# Patient Record
Sex: Female | Born: 1950 | Race: White | Hispanic: No | Marital: Married | Smoking: Never smoker
Health system: Southern US, Community
[De-identification: ages and names within clinical notes are randomized; demographics above are authoritative.]

## PROBLEM LIST (undated history)

## (undated) DIAGNOSIS — E079 Disorder of thyroid, unspecified: Secondary | ICD-10-CM

## (undated) DIAGNOSIS — H269 Unspecified cataract: Secondary | ICD-10-CM

## (undated) HISTORY — DX: Disorder of thyroid, unspecified: E07.9

## (undated) HISTORY — DX: Unspecified cataract: H26.9

---
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
## 1971-02-15 HISTORY — PX: WISDOM TOOTH EXTRACTION: SHX21

## 1972-02-15 HISTORY — PX: EYE SURGERY: SHX253

## 1995-02-15 HISTORY — PX: BREAST BIOPSY: SHX20

## 1998-04-20 ENCOUNTER — Other Ambulatory Visit: Admission: RE | Admit: 1998-04-20 | Discharge: 1998-04-20 | Payer: Self-pay | Admitting: Obstetrics & Gynecology

## 1999-04-02 ENCOUNTER — Other Ambulatory Visit: Admission: RE | Admit: 1999-04-02 | Discharge: 1999-04-02 | Payer: Self-pay | Admitting: Obstetrics and Gynecology

## 1999-11-29 ENCOUNTER — Encounter: Admission: RE | Admit: 1999-11-29 | Discharge: 1999-11-29 | Payer: Self-pay | Admitting: Obstetrics and Gynecology

## 1999-11-29 ENCOUNTER — Encounter: Payer: Self-pay | Admitting: Obstetrics and Gynecology

## 2000-08-01 ENCOUNTER — Other Ambulatory Visit: Admission: RE | Admit: 2000-08-01 | Discharge: 2000-08-01 | Payer: Self-pay | Admitting: Obstetrics and Gynecology

## 2000-12-20 ENCOUNTER — Encounter: Payer: Self-pay | Admitting: Obstetrics and Gynecology

## 2000-12-20 ENCOUNTER — Encounter: Admission: RE | Admit: 2000-12-20 | Discharge: 2000-12-20 | Payer: Self-pay | Admitting: Obstetrics and Gynecology

## 2000-12-22 ENCOUNTER — Encounter: Admission: RE | Admit: 2000-12-22 | Discharge: 2000-12-22 | Payer: Self-pay | Admitting: Obstetrics and Gynecology

## 2000-12-22 ENCOUNTER — Encounter: Payer: Self-pay | Admitting: Obstetrics and Gynecology

## 2001-08-02 ENCOUNTER — Other Ambulatory Visit: Admission: RE | Admit: 2001-08-02 | Discharge: 2001-08-02 | Payer: Self-pay | Admitting: Obstetrics and Gynecology

## 2002-01-07 ENCOUNTER — Encounter: Admission: RE | Admit: 2002-01-07 | Discharge: 2002-01-07 | Payer: Self-pay | Admitting: Obstetrics and Gynecology

## 2002-01-07 ENCOUNTER — Encounter: Payer: Self-pay | Admitting: Obstetrics and Gynecology

## 2002-09-23 ENCOUNTER — Other Ambulatory Visit: Admission: RE | Admit: 2002-09-23 | Discharge: 2002-09-23 | Payer: Self-pay | Admitting: Obstetrics and Gynecology

## 2003-07-31 ENCOUNTER — Encounter: Admission: RE | Admit: 2003-07-31 | Discharge: 2003-07-31 | Payer: Self-pay | Admitting: Obstetrics and Gynecology

## 2003-09-30 ENCOUNTER — Other Ambulatory Visit: Admission: RE | Admit: 2003-09-30 | Discharge: 2003-09-30 | Payer: Self-pay | Admitting: Obstetrics and Gynecology

## 2004-09-21 ENCOUNTER — Encounter: Admission: RE | Admit: 2004-09-21 | Discharge: 2004-09-21 | Payer: Self-pay | Admitting: Obstetrics and Gynecology

## 2004-09-21 ENCOUNTER — Other Ambulatory Visit: Admission: RE | Admit: 2004-09-21 | Discharge: 2004-09-21 | Payer: Self-pay | Admitting: Obstetrics and Gynecology

## 2004-10-22 ENCOUNTER — Encounter: Admission: RE | Admit: 2004-10-22 | Discharge: 2004-10-22 | Payer: Self-pay | Admitting: Gynecology

## 2005-02-14 HISTORY — PX: COLONOSCOPY: SHX174

## 2005-04-29 ENCOUNTER — Ambulatory Visit: Payer: Self-pay | Admitting: Sports Medicine

## 2005-05-06 ENCOUNTER — Ambulatory Visit: Payer: Self-pay | Admitting: Sports Medicine

## 2005-11-23 ENCOUNTER — Encounter: Admission: RE | Admit: 2005-11-23 | Discharge: 2005-11-23 | Payer: Self-pay | Admitting: Obstetrics and Gynecology

## 2005-12-08 ENCOUNTER — Ambulatory Visit: Payer: Self-pay | Admitting: Internal Medicine

## 2005-12-22 ENCOUNTER — Encounter (INDEPENDENT_AMBULATORY_CARE_PROVIDER_SITE_OTHER): Payer: Self-pay | Admitting: *Deleted

## 2005-12-22 ENCOUNTER — Ambulatory Visit: Payer: Self-pay | Admitting: Internal Medicine

## 2007-01-17 ENCOUNTER — Encounter: Admission: RE | Admit: 2007-01-17 | Discharge: 2007-01-17 | Payer: Self-pay | Admitting: Obstetrics and Gynecology

## 2007-01-31 ENCOUNTER — Encounter: Admission: RE | Admit: 2007-01-31 | Discharge: 2007-01-31 | Payer: Self-pay | Admitting: Obstetrics and Gynecology

## 2007-01-31 ENCOUNTER — Encounter (INDEPENDENT_AMBULATORY_CARE_PROVIDER_SITE_OTHER): Payer: Self-pay | Admitting: Diagnostic Radiology

## 2007-08-29 ENCOUNTER — Encounter: Admission: RE | Admit: 2007-08-29 | Discharge: 2007-08-29 | Payer: Self-pay | Admitting: Obstetrics and Gynecology

## 2008-03-03 ENCOUNTER — Encounter: Admission: RE | Admit: 2008-03-03 | Discharge: 2008-03-03 | Payer: Self-pay | Admitting: Obstetrics and Gynecology

## 2008-09-24 ENCOUNTER — Encounter: Admission: RE | Admit: 2008-09-24 | Discharge: 2008-09-24 | Payer: Self-pay | Admitting: Obstetrics and Gynecology

## 2009-03-18 ENCOUNTER — Encounter: Admission: RE | Admit: 2009-03-18 | Discharge: 2009-03-18 | Payer: Self-pay | Admitting: Obstetrics and Gynecology

## 2010-03-07 ENCOUNTER — Encounter: Payer: Self-pay | Admitting: Gynecology

## 2010-06-29 ENCOUNTER — Other Ambulatory Visit: Payer: Self-pay | Admitting: Obstetrics and Gynecology

## 2010-06-29 DIAGNOSIS — Z1231 Encounter for screening mammogram for malignant neoplasm of breast: Secondary | ICD-10-CM

## 2010-06-29 DIAGNOSIS — Z780 Asymptomatic menopausal state: Secondary | ICD-10-CM

## 2010-07-07 ENCOUNTER — Ambulatory Visit
Admission: RE | Admit: 2010-07-07 | Discharge: 2010-07-07 | Disposition: A | Discharge status: Home / Self Care | Payer: 59 | Source: Ambulatory Visit | Attending: Obstetrics and Gynecology | Admitting: Obstetrics and Gynecology

## 2010-07-07 DIAGNOSIS — Z780 Asymptomatic menopausal state: Secondary | ICD-10-CM

## 2010-07-07 DIAGNOSIS — Z1231 Encounter for screening mammogram for malignant neoplasm of breast: Secondary | ICD-10-CM

## 2010-12-23 ENCOUNTER — Encounter: Payer: Self-pay | Admitting: Internal Medicine

## 2011-02-15 HISTORY — PX: COLONOSCOPY: SHX174

## 2011-03-22 ENCOUNTER — Encounter: Payer: Self-pay | Admitting: Internal Medicine

## 2011-04-12 ENCOUNTER — Ambulatory Visit (AMBULATORY_SURGERY_CENTER): Payer: 59 | Admitting: *Deleted

## 2011-04-12 VITALS — Ht 66.0 in | Wt 130.4 lb

## 2011-04-12 DIAGNOSIS — Z1211 Encounter for screening for malignant neoplasm of colon: Secondary | ICD-10-CM

## 2011-04-12 MED ORDER — PEG-KCL-NACL-NASULF-NA ASC-C 100 G PO SOLR: ORAL | Status: DC

## 2011-04-26 ENCOUNTER — Encounter: Payer: Self-pay | Admitting: Internal Medicine

## 2011-04-26 ENCOUNTER — Ambulatory Visit (AMBULATORY_SURGERY_CENTER): Payer: 59 | Admitting: Internal Medicine

## 2011-04-26 VITALS — BP 110/70 | HR 62 | Temp 98.1°F | Resp 18 | Ht 66.0 in | Wt 130.0 lb

## 2011-04-26 DIAGNOSIS — Z1211 Encounter for screening for malignant neoplasm of colon: Secondary | ICD-10-CM

## 2011-04-26 DIAGNOSIS — Z86010 Personal history of colonic polyps: Secondary | ICD-10-CM

## 2011-04-26 DIAGNOSIS — Z800 Family history of malignant neoplasm of digestive organs: Secondary | ICD-10-CM

## 2011-04-26 MED ORDER — SODIUM CHLORIDE 0.9 % IV SOLN: 500.0000 mL | INTRAVENOUS | Status: DC

## 2011-04-26 NOTE — Patient Instructions (Signed)
YOU HAD AN ENDOSCOPIC PROCEDURE TODAY AT THE [facility]: Refer to the procedure report that was given to you for any specific questions about what was found during the examination.  If the procedure report does not answer your questions, please call your gastroenterologist to clarify.  If you requested that your care partner not be given the details of your procedure findings, then the procedure report has been included in a sealed envelope for you to review at your convenience later.  YOU SHOULD EXPECT: Some feelings of bloating in the abdomen. Passage of more gas than usual.  Walking can help get rid of the air that was put into your GI tract during the procedure and reduce the bloating. If you had a lower endoscopy (such as a colonoscopy or flexible sigmoidoscopy) you may notice spotting of blood in your stool or on the toilet paper. If you underwent a bowel prep for your procedure, then you may not have a normal bowel movement for a few days.  DIET: Your first meal following the procedure should be a light meal and then it is ok to progress to your normal diet.  A half-sandwich or bowl of soup is an example of a good first meal.  Heavy or fried foods are harder to digest and may make you feel nauseous or bloated.  Likewise meals heavy in dairy and vegetables can cause extra gas to form and this can also increase the bloating.  Drink plenty of fluids but you should avoid alcoholic beverages for 24 hours.  ACTIVITY: Your care partner should take you home directly after the procedure.  You should plan to take it easy, moving slowly for the rest of the day.  You can resume normal activity the day after the procedure however you should NOT DRIVE or use heavy machinery for 24 hours (because of the sedation medicines used during the test).    SYMPTOMS TO REPORT IMMEDIATELY: A gastroenterologist can be reached at any hour.  During normal business hours, 8:30 AM to 5:00 PM Monday through Friday,  call [phone].  After hours and on weekends, please call the GI answering service at [phone] who will take a message and have the physician on call contact you.   Following lower endoscopy (colonoscopy or flexible sigmoidoscopy):  Excessive amounts of blood in the stool  Significant tenderness or worsening of abdominal pains  Swelling of the abdomen that is new, acute  Fever of 100F or higher    FOLLOW UP: If any biopsies were taken you will be contacted by phone or by letter within the next 1-3 weeks.  Call your gastroenterologist if you have not heard about the biopsies in 3 weeks.  Our staff will call the home number listed on your records the next business day following your procedure to check on you and address any questions or concerns that you may have at that time regarding the information given to you following your procedure. This is a courtesy call and so if there is no answer at the home number and we have not heard from you through the emergency physician on call, we will assume that you have returned to your regular daily activities without incident.  SIGNATURES/CONFIDENTIALITY: You and/or your care partner have signed paperwork which will be entered into your electronic medical record.  These signatures attest to the fact that that the information above on your After Visit Summary has been reviewed and is understood.  Full responsibility of the confidentiality   of this discharge information lies with you and/or your care-partner.     

## 2011-04-26 NOTE — Progress Notes (Signed)
Patient did not experience any of the following events: a burn prior to discharge; a fall within the facility; wrong site/side/patient/procedure/implant event; or a hospital transfer or hospital admission upon discharge from the facility. (G8907) Patient did not have preoperative order for IV antibiotic SSI prophylaxis. (G8918)  

## 2011-04-26 NOTE — Op Note (Signed)
[facility] [address] [place], [place]  [address]  COLONOSCOPY PROCEDURE REPORT  PATIENT:  Kiara Logan, Kiara Logan  MR#:  [id number] BIRTHDATE:  1950/12/17, 60 yrs. old  GENDER:  female ENDOSCOPIST:  Wilhemina Bonito. Eda Keys, MD REF. BY:  Surveillance Program Recall, PROCEDURE DATE:  04/26/2011 PROCEDURE:  Surveillance Colonoscopy ASA CLASS:  Class II INDICATIONS:  history of polyps, surveillance and high-risk screening, family history of colon cancer ; index 12-2005 w/ nonadenomatous polyp MEDICATIONS:   MAC sedation, administered by CRNA, propofol (Diprivan) 200 mg IV  DESCRIPTION OF PROCEDURE:   After the risks benefits and alternatives of the procedure were thoroughly explained, informed consent was obtained.  Digital rectal exam was performed and revealed no abnormalities.   The LB 180AL [id number] endoscope was introduced through the anus and advanced to the cecum, which was identified by both the appendix and ileocecal valve, without limitations.  The quality of the prep was excellent, using MoviPrep.  The instrument was then slowly withdrawn as the colon was fully examined. <<PROCEDUREIMAGES>>  FINDINGS:  A normal appearing cecum, ileocecal valve, and appendiceal orifice were identified. The ascending, hepatic flexure, transverse, splenic flexure, descending, sigmoid colon, and rectum appeared unremarkable.  No polyps or cancers were seen. Retroflexed views in the rectum revealed no abnormalities.    The time to cecum =  4:25  minutes. The scope was then withdrawn in 7:53  minutes from the cecum and the procedure completed.  COMPLICATIONS:  None  ENDOSCOPIC IMPRESSION: 1) Normal colon 2) No polyps or cancers  RECOMMENDATIONS: 1) Continue current colorectal screening recommendations  with a repeat colonoscopy in 10 years.  ______________________________ Wilhemina Bonito. Eda Keys, MD  CC:  Juluis Rainier, MD;  The Patient  n. eSIGNED:   Wilhemina Bonito. Eda Keys at 04/26/2011  02:09 PM  Gershon Crane, [id number]

## 2011-04-27 ENCOUNTER — Telehealth: Payer: Self-pay | Admitting: *Deleted

## 2011-04-27 NOTE — Telephone Encounter (Signed)
  Follow up Call-  Call back number 04/26/2011  Post procedure Call Back phone  # [phone] cell  Permission to leave phone message Yes     Patient questions:  Do you have a fever, pain , or abdominal swelling? no Pain Score  0 *  Have you tolerated food without any problems? yes  Have you been able to return to your normal activities? yes  Do you have any questions about your discharge instructions: Diet   no Medications  no Follow up visit  no  Do you have questions or concerns about your Care? no  Actions: * If pain score is 4 or above: No action needed, pain <4.

## 2011-08-17 ENCOUNTER — Other Ambulatory Visit: Payer: Self-pay | Admitting: Obstetrics and Gynecology

## 2011-08-17 DIAGNOSIS — Z1231 Encounter for screening mammogram for malignant neoplasm of breast: Secondary | ICD-10-CM

## 2011-08-30 ENCOUNTER — Ambulatory Visit
Admission: RE | Admit: 2011-08-30 | Discharge: 2011-08-30 | Disposition: A | Discharge status: Home / Self Care | Payer: 59 | Source: Ambulatory Visit | Attending: Obstetrics and Gynecology | Admitting: Obstetrics and Gynecology

## 2011-08-30 DIAGNOSIS — Z1231 Encounter for screening mammogram for malignant neoplasm of breast: Secondary | ICD-10-CM

## 2012-11-07 ENCOUNTER — Other Ambulatory Visit: Payer: Self-pay

## 2012-11-07 DIAGNOSIS — Z1231 Encounter for screening mammogram for malignant neoplasm of breast: Secondary | ICD-10-CM

## 2012-11-27 ENCOUNTER — Ambulatory Visit: Payer: 59

## 2012-12-11 ENCOUNTER — Ambulatory Visit
Admission: RE | Admit: 2012-12-11 | Discharge: 2012-12-11 | Disposition: A | Discharge status: Home / Self Care | Payer: 59 | Source: Ambulatory Visit

## 2012-12-11 DIAGNOSIS — Z1231 Encounter for screening mammogram for malignant neoplasm of breast: Secondary | ICD-10-CM

## 2013-02-14 HISTORY — PX: BUNIONECTOMY: SHX129

## 2014-02-14 HISTORY — PX: BUNIONECTOMY: SHX129

## 2014-06-17 DIAGNOSIS — N952 Postmenopausal atrophic vaginitis: Secondary | ICD-10-CM | POA: Diagnosis not present

## 2014-06-17 DIAGNOSIS — Z6821 Body mass index (BMI) 21.0-21.9, adult: Secondary | ICD-10-CM | POA: Diagnosis not present

## 2014-06-17 DIAGNOSIS — Z1231 Encounter for screening mammogram for malignant neoplasm of breast: Secondary | ICD-10-CM | POA: Diagnosis not present

## 2014-06-17 DIAGNOSIS — Z01419 Encounter for gynecological examination (general) (routine) without abnormal findings: Secondary | ICD-10-CM | POA: Diagnosis not present

## 2014-06-17 DIAGNOSIS — Z124 Encounter for screening for malignant neoplasm of cervix: Secondary | ICD-10-CM | POA: Diagnosis not present

## 2014-06-17 DIAGNOSIS — Z1389 Encounter for screening for other disorder: Secondary | ICD-10-CM | POA: Diagnosis not present

## 2014-06-17 DIAGNOSIS — Z1151 Encounter for screening for human papillomavirus (HPV): Secondary | ICD-10-CM | POA: Diagnosis not present

## 2014-06-18 DIAGNOSIS — L814 Other melanin hyperpigmentation: Secondary | ICD-10-CM | POA: Diagnosis not present

## 2014-06-18 DIAGNOSIS — L821 Other seborrheic keratosis: Secondary | ICD-10-CM | POA: Diagnosis not present

## 2014-06-18 DIAGNOSIS — D2371 Other benign neoplasm of skin of right lower limb, including hip: Secondary | ICD-10-CM | POA: Diagnosis not present

## 2014-06-18 DIAGNOSIS — D1801 Hemangioma of skin and subcutaneous tissue: Secondary | ICD-10-CM | POA: Diagnosis not present

## 2014-06-18 DIAGNOSIS — L919 Hypertrophic disorder of the skin, unspecified: Secondary | ICD-10-CM | POA: Diagnosis not present

## 2014-06-18 DIAGNOSIS — D225 Melanocytic nevi of trunk: Secondary | ICD-10-CM | POA: Diagnosis not present

## 2014-08-11 DIAGNOSIS — J3489 Other specified disorders of nose and nasal sinuses: Secondary | ICD-10-CM | POA: Diagnosis not present

## 2014-09-19 DIAGNOSIS — N390 Urinary tract infection, site not specified: Secondary | ICD-10-CM | POA: Diagnosis not present

## 2014-09-24 DIAGNOSIS — M2041 Other hammer toe(s) (acquired), right foot: Secondary | ICD-10-CM | POA: Diagnosis not present

## 2014-09-24 DIAGNOSIS — M2011 Hallux valgus (acquired), right foot: Secondary | ICD-10-CM | POA: Diagnosis not present

## 2014-10-06 DIAGNOSIS — M8588 Other specified disorders of bone density and structure, other site: Secondary | ICD-10-CM | POA: Diagnosis not present

## 2014-10-06 DIAGNOSIS — Z79899 Other long term (current) drug therapy: Secondary | ICD-10-CM | POA: Diagnosis not present

## 2014-10-06 DIAGNOSIS — Z0000 Encounter for general adult medical examination without abnormal findings: Secondary | ICD-10-CM | POA: Diagnosis not present

## 2014-10-06 DIAGNOSIS — E780 Pure hypercholesterolemia: Secondary | ICD-10-CM | POA: Diagnosis not present

## 2014-10-06 DIAGNOSIS — Z1389 Encounter for screening for other disorder: Secondary | ICD-10-CM | POA: Diagnosis not present

## 2014-10-06 DIAGNOSIS — E039 Hypothyroidism, unspecified: Secondary | ICD-10-CM | POA: Diagnosis not present

## 2014-11-19 DIAGNOSIS — D485 Neoplasm of uncertain behavior of skin: Secondary | ICD-10-CM | POA: Diagnosis not present

## 2014-11-19 DIAGNOSIS — L820 Inflamed seborrheic keratosis: Secondary | ICD-10-CM | POA: Diagnosis not present

## 2014-12-16 DIAGNOSIS — Q6621 Congenital metatarsus primus varus: Secondary | ICD-10-CM | POA: Diagnosis not present

## 2014-12-16 DIAGNOSIS — M21611 Bunion of right foot: Secondary | ICD-10-CM | POA: Diagnosis not present

## 2014-12-16 DIAGNOSIS — G8918 Other acute postprocedural pain: Secondary | ICD-10-CM | POA: Diagnosis not present

## 2014-12-16 DIAGNOSIS — M7741 Metatarsalgia, right foot: Secondary | ICD-10-CM | POA: Diagnosis not present

## 2014-12-16 DIAGNOSIS — M2010 Hallux valgus (acquired), unspecified foot: Secondary | ICD-10-CM | POA: Diagnosis not present

## 2014-12-16 DIAGNOSIS — M2011 Hallux valgus (acquired), right foot: Secondary | ICD-10-CM | POA: Diagnosis not present

## 2014-12-16 DIAGNOSIS — M216X1 Other acquired deformities of right foot: Secondary | ICD-10-CM | POA: Diagnosis not present

## 2014-12-16 DIAGNOSIS — M2041 Other hammer toe(s) (acquired), right foot: Secondary | ICD-10-CM | POA: Diagnosis not present

## 2014-12-30 DIAGNOSIS — E039 Hypothyroidism, unspecified: Secondary | ICD-10-CM | POA: Diagnosis not present

## 2014-12-30 DIAGNOSIS — E7800 Pure hypercholesterolemia, unspecified: Secondary | ICD-10-CM | POA: Diagnosis not present

## 2015-01-01 DIAGNOSIS — Z4789 Encounter for other orthopedic aftercare: Secondary | ICD-10-CM | POA: Diagnosis not present

## 2015-01-30 DIAGNOSIS — Z4789 Encounter for other orthopedic aftercare: Secondary | ICD-10-CM | POA: Diagnosis not present

## 2015-02-27 DIAGNOSIS — Z4789 Encounter for other orthopedic aftercare: Secondary | ICD-10-CM | POA: Diagnosis not present

## 2015-03-25 DIAGNOSIS — E039 Hypothyroidism, unspecified: Secondary | ICD-10-CM | POA: Diagnosis not present

## 2015-03-25 DIAGNOSIS — E7800 Pure hypercholesterolemia, unspecified: Secondary | ICD-10-CM | POA: Diagnosis not present

## 2015-03-30 ENCOUNTER — Encounter: Payer: Self-pay | Admitting: Internal Medicine

## 2015-06-11 DIAGNOSIS — E7800 Pure hypercholesterolemia, unspecified: Secondary | ICD-10-CM | POA: Diagnosis not present

## 2015-06-11 DIAGNOSIS — E039 Hypothyroidism, unspecified: Secondary | ICD-10-CM | POA: Diagnosis not present

## 2015-07-03 DIAGNOSIS — Z124 Encounter for screening for malignant neoplasm of cervix: Secondary | ICD-10-CM | POA: Diagnosis not present

## 2015-07-03 DIAGNOSIS — Z01419 Encounter for gynecological examination (general) (routine) without abnormal findings: Secondary | ICD-10-CM | POA: Diagnosis not present

## 2015-07-03 DIAGNOSIS — Z1231 Encounter for screening mammogram for malignant neoplasm of breast: Secondary | ICD-10-CM | POA: Diagnosis not present

## 2015-07-03 DIAGNOSIS — Z6821 Body mass index (BMI) 21.0-21.9, adult: Secondary | ICD-10-CM | POA: Diagnosis not present

## 2015-07-17 DIAGNOSIS — L814 Other melanin hyperpigmentation: Secondary | ICD-10-CM | POA: Diagnosis not present

## 2015-07-17 DIAGNOSIS — L821 Other seborrheic keratosis: Secondary | ICD-10-CM | POA: Diagnosis not present

## 2015-07-17 DIAGNOSIS — D2371 Other benign neoplasm of skin of right lower limb, including hip: Secondary | ICD-10-CM | POA: Diagnosis not present

## 2015-07-17 DIAGNOSIS — D485 Neoplasm of uncertain behavior of skin: Secondary | ICD-10-CM | POA: Diagnosis not present

## 2015-07-17 DIAGNOSIS — D1801 Hemangioma of skin and subcutaneous tissue: Secondary | ICD-10-CM | POA: Diagnosis not present

## 2015-07-23 DIAGNOSIS — E039 Hypothyroidism, unspecified: Secondary | ICD-10-CM | POA: Diagnosis not present

## 2015-07-23 DIAGNOSIS — E7800 Pure hypercholesterolemia, unspecified: Secondary | ICD-10-CM | POA: Diagnosis not present

## 2015-07-31 DIAGNOSIS — N904 Leukoplakia of vulva: Secondary | ICD-10-CM | POA: Diagnosis not present

## 2015-07-31 DIAGNOSIS — N952 Postmenopausal atrophic vaginitis: Secondary | ICD-10-CM | POA: Diagnosis not present

## 2015-07-31 DIAGNOSIS — N941 Unspecified dyspareunia: Secondary | ICD-10-CM | POA: Diagnosis not present

## 2015-10-22 DIAGNOSIS — E7800 Pure hypercholesterolemia, unspecified: Secondary | ICD-10-CM | POA: Diagnosis not present

## 2015-10-22 DIAGNOSIS — E039 Hypothyroidism, unspecified: Secondary | ICD-10-CM | POA: Diagnosis not present

## 2016-01-20 DIAGNOSIS — L309 Dermatitis, unspecified: Secondary | ICD-10-CM | POA: Diagnosis not present

## 2016-01-20 DIAGNOSIS — D225 Melanocytic nevi of trunk: Secondary | ICD-10-CM | POA: Diagnosis not present

## 2016-01-20 DIAGNOSIS — L821 Other seborrheic keratosis: Secondary | ICD-10-CM | POA: Diagnosis not present

## 2016-01-22 DIAGNOSIS — E039 Hypothyroidism, unspecified: Secondary | ICD-10-CM | POA: Diagnosis not present

## 2016-02-06 DIAGNOSIS — J069 Acute upper respiratory infection, unspecified: Secondary | ICD-10-CM | POA: Diagnosis not present

## 2016-04-06 DIAGNOSIS — E039 Hypothyroidism, unspecified: Secondary | ICD-10-CM | POA: Diagnosis not present

## 2016-06-10 DIAGNOSIS — L814 Other melanin hyperpigmentation: Secondary | ICD-10-CM | POA: Diagnosis not present

## 2016-06-10 DIAGNOSIS — L821 Other seborrheic keratosis: Secondary | ICD-10-CM | POA: Diagnosis not present

## 2016-06-10 DIAGNOSIS — D2371 Other benign neoplasm of skin of right lower limb, including hip: Secondary | ICD-10-CM | POA: Diagnosis not present

## 2016-06-30 DIAGNOSIS — E039 Hypothyroidism, unspecified: Secondary | ICD-10-CM | POA: Diagnosis not present

## 2016-07-28 DIAGNOSIS — Z1231 Encounter for screening mammogram for malignant neoplasm of breast: Secondary | ICD-10-CM | POA: Diagnosis not present

## 2016-07-28 DIAGNOSIS — Z1389 Encounter for screening for other disorder: Secondary | ICD-10-CM | POA: Diagnosis not present

## 2016-07-28 DIAGNOSIS — Z124 Encounter for screening for malignant neoplasm of cervix: Secondary | ICD-10-CM | POA: Diagnosis not present

## 2016-07-28 DIAGNOSIS — Z01419 Encounter for gynecological examination (general) (routine) without abnormal findings: Secondary | ICD-10-CM | POA: Diagnosis not present

## 2016-08-01 DIAGNOSIS — Z1389 Encounter for screening for other disorder: Secondary | ICD-10-CM | POA: Diagnosis not present

## 2016-08-01 DIAGNOSIS — M8588 Other specified disorders of bone density and structure, other site: Secondary | ICD-10-CM | POA: Diagnosis not present

## 2016-08-01 DIAGNOSIS — Z0000 Encounter for general adult medical examination without abnormal findings: Secondary | ICD-10-CM | POA: Diagnosis not present

## 2016-08-01 DIAGNOSIS — E7800 Pure hypercholesterolemia, unspecified: Secondary | ICD-10-CM | POA: Diagnosis not present

## 2016-08-01 DIAGNOSIS — E039 Hypothyroidism, unspecified: Secondary | ICD-10-CM | POA: Diagnosis not present

## 2017-02-27 DIAGNOSIS — M530 Cervicocranial syndrome: Secondary | ICD-10-CM | POA: Diagnosis not present

## 2017-02-27 DIAGNOSIS — M9901 Segmental and somatic dysfunction of cervical region: Secondary | ICD-10-CM | POA: Diagnosis not present

## 2017-02-27 DIAGNOSIS — M9903 Segmental and somatic dysfunction of lumbar region: Secondary | ICD-10-CM | POA: Diagnosis not present

## 2017-02-27 DIAGNOSIS — M9902 Segmental and somatic dysfunction of thoracic region: Secondary | ICD-10-CM | POA: Diagnosis not present

## 2017-03-01 DIAGNOSIS — M9901 Segmental and somatic dysfunction of cervical region: Secondary | ICD-10-CM | POA: Diagnosis not present

## 2017-03-01 DIAGNOSIS — M9902 Segmental and somatic dysfunction of thoracic region: Secondary | ICD-10-CM | POA: Diagnosis not present

## 2017-03-01 DIAGNOSIS — M9903 Segmental and somatic dysfunction of lumbar region: Secondary | ICD-10-CM | POA: Diagnosis not present

## 2017-03-01 DIAGNOSIS — M530 Cervicocranial syndrome: Secondary | ICD-10-CM | POA: Diagnosis not present

## 2017-03-06 DIAGNOSIS — M9901 Segmental and somatic dysfunction of cervical region: Secondary | ICD-10-CM | POA: Diagnosis not present

## 2017-03-06 DIAGNOSIS — M9902 Segmental and somatic dysfunction of thoracic region: Secondary | ICD-10-CM | POA: Diagnosis not present

## 2017-03-06 DIAGNOSIS — M9903 Segmental and somatic dysfunction of lumbar region: Secondary | ICD-10-CM | POA: Diagnosis not present

## 2017-03-06 DIAGNOSIS — M530 Cervicocranial syndrome: Secondary | ICD-10-CM | POA: Diagnosis not present

## 2017-03-08 DIAGNOSIS — M9901 Segmental and somatic dysfunction of cervical region: Secondary | ICD-10-CM | POA: Diagnosis not present

## 2017-03-08 DIAGNOSIS — M9903 Segmental and somatic dysfunction of lumbar region: Secondary | ICD-10-CM | POA: Diagnosis not present

## 2017-03-08 DIAGNOSIS — M530 Cervicocranial syndrome: Secondary | ICD-10-CM | POA: Diagnosis not present

## 2017-03-08 DIAGNOSIS — M9902 Segmental and somatic dysfunction of thoracic region: Secondary | ICD-10-CM | POA: Diagnosis not present

## 2017-03-13 DIAGNOSIS — M9902 Segmental and somatic dysfunction of thoracic region: Secondary | ICD-10-CM | POA: Diagnosis not present

## 2017-03-13 DIAGNOSIS — M9903 Segmental and somatic dysfunction of lumbar region: Secondary | ICD-10-CM | POA: Diagnosis not present

## 2017-03-13 DIAGNOSIS — M530 Cervicocranial syndrome: Secondary | ICD-10-CM | POA: Diagnosis not present

## 2017-03-13 DIAGNOSIS — M9901 Segmental and somatic dysfunction of cervical region: Secondary | ICD-10-CM | POA: Diagnosis not present

## 2017-03-15 DIAGNOSIS — M9903 Segmental and somatic dysfunction of lumbar region: Secondary | ICD-10-CM | POA: Diagnosis not present

## 2017-03-15 DIAGNOSIS — M530 Cervicocranial syndrome: Secondary | ICD-10-CM | POA: Diagnosis not present

## 2017-03-15 DIAGNOSIS — M9902 Segmental and somatic dysfunction of thoracic region: Secondary | ICD-10-CM | POA: Diagnosis not present

## 2017-03-15 DIAGNOSIS — M9901 Segmental and somatic dysfunction of cervical region: Secondary | ICD-10-CM | POA: Diagnosis not present

## 2017-03-20 DIAGNOSIS — M9903 Segmental and somatic dysfunction of lumbar region: Secondary | ICD-10-CM | POA: Diagnosis not present

## 2017-03-20 DIAGNOSIS — M9901 Segmental and somatic dysfunction of cervical region: Secondary | ICD-10-CM | POA: Diagnosis not present

## 2017-03-20 DIAGNOSIS — M9902 Segmental and somatic dysfunction of thoracic region: Secondary | ICD-10-CM | POA: Diagnosis not present

## 2017-03-20 DIAGNOSIS — M530 Cervicocranial syndrome: Secondary | ICD-10-CM | POA: Diagnosis not present

## 2017-03-22 DIAGNOSIS — M9901 Segmental and somatic dysfunction of cervical region: Secondary | ICD-10-CM | POA: Diagnosis not present

## 2017-03-22 DIAGNOSIS — M530 Cervicocranial syndrome: Secondary | ICD-10-CM | POA: Diagnosis not present

## 2017-03-22 DIAGNOSIS — M9903 Segmental and somatic dysfunction of lumbar region: Secondary | ICD-10-CM | POA: Diagnosis not present

## 2017-03-22 DIAGNOSIS — M9902 Segmental and somatic dysfunction of thoracic region: Secondary | ICD-10-CM | POA: Diagnosis not present

## 2017-03-27 DIAGNOSIS — M9901 Segmental and somatic dysfunction of cervical region: Secondary | ICD-10-CM | POA: Diagnosis not present

## 2017-03-27 DIAGNOSIS — M9902 Segmental and somatic dysfunction of thoracic region: Secondary | ICD-10-CM | POA: Diagnosis not present

## 2017-03-27 DIAGNOSIS — M9903 Segmental and somatic dysfunction of lumbar region: Secondary | ICD-10-CM | POA: Diagnosis not present

## 2017-03-27 DIAGNOSIS — M530 Cervicocranial syndrome: Secondary | ICD-10-CM | POA: Diagnosis not present

## 2017-03-29 DIAGNOSIS — M9901 Segmental and somatic dysfunction of cervical region: Secondary | ICD-10-CM | POA: Diagnosis not present

## 2017-03-29 DIAGNOSIS — M9903 Segmental and somatic dysfunction of lumbar region: Secondary | ICD-10-CM | POA: Diagnosis not present

## 2017-03-29 DIAGNOSIS — M9902 Segmental and somatic dysfunction of thoracic region: Secondary | ICD-10-CM | POA: Diagnosis not present

## 2017-03-29 DIAGNOSIS — M530 Cervicocranial syndrome: Secondary | ICD-10-CM | POA: Diagnosis not present

## 2017-04-05 DIAGNOSIS — M9903 Segmental and somatic dysfunction of lumbar region: Secondary | ICD-10-CM | POA: Diagnosis not present

## 2017-04-05 DIAGNOSIS — M9901 Segmental and somatic dysfunction of cervical region: Secondary | ICD-10-CM | POA: Diagnosis not present

## 2017-04-05 DIAGNOSIS — M530 Cervicocranial syndrome: Secondary | ICD-10-CM | POA: Diagnosis not present

## 2017-04-05 DIAGNOSIS — M9902 Segmental and somatic dysfunction of thoracic region: Secondary | ICD-10-CM | POA: Diagnosis not present

## 2017-04-12 DIAGNOSIS — M9903 Segmental and somatic dysfunction of lumbar region: Secondary | ICD-10-CM | POA: Diagnosis not present

## 2017-04-12 DIAGNOSIS — M9901 Segmental and somatic dysfunction of cervical region: Secondary | ICD-10-CM | POA: Diagnosis not present

## 2017-04-12 DIAGNOSIS — M9902 Segmental and somatic dysfunction of thoracic region: Secondary | ICD-10-CM | POA: Diagnosis not present

## 2017-04-12 DIAGNOSIS — M530 Cervicocranial syndrome: Secondary | ICD-10-CM | POA: Diagnosis not present

## 2017-05-04 DIAGNOSIS — D225 Melanocytic nevi of trunk: Secondary | ICD-10-CM | POA: Diagnosis not present

## 2017-05-04 DIAGNOSIS — L814 Other melanin hyperpigmentation: Secondary | ICD-10-CM | POA: Diagnosis not present

## 2017-05-04 DIAGNOSIS — D2371 Other benign neoplasm of skin of right lower limb, including hip: Secondary | ICD-10-CM | POA: Diagnosis not present

## 2017-05-04 DIAGNOSIS — L821 Other seborrheic keratosis: Secondary | ICD-10-CM | POA: Diagnosis not present

## 2017-06-28 DIAGNOSIS — M79671 Pain in right foot: Secondary | ICD-10-CM | POA: Diagnosis not present

## 2017-06-28 DIAGNOSIS — Z978 Presence of other specified devices: Secondary | ICD-10-CM | POA: Diagnosis not present

## 2017-06-28 DIAGNOSIS — M2041 Other hammer toe(s) (acquired), right foot: Secondary | ICD-10-CM | POA: Diagnosis not present

## 2017-07-25 DIAGNOSIS — M2041 Other hammer toe(s) (acquired), right foot: Secondary | ICD-10-CM | POA: Diagnosis not present

## 2017-07-25 DIAGNOSIS — Z472 Encounter for removal of internal fixation device: Secondary | ICD-10-CM | POA: Diagnosis not present

## 2017-07-31 DIAGNOSIS — Z01419 Encounter for gynecological examination (general) (routine) without abnormal findings: Secondary | ICD-10-CM | POA: Diagnosis not present

## 2017-07-31 DIAGNOSIS — Z6821 Body mass index (BMI) 21.0-21.9, adult: Secondary | ICD-10-CM | POA: Diagnosis not present

## 2017-07-31 DIAGNOSIS — Z1231 Encounter for screening mammogram for malignant neoplasm of breast: Secondary | ICD-10-CM | POA: Diagnosis not present

## 2017-07-31 DIAGNOSIS — Z1389 Encounter for screening for other disorder: Secondary | ICD-10-CM | POA: Diagnosis not present

## 2017-08-03 DIAGNOSIS — Z1331 Encounter for screening for depression: Secondary | ICD-10-CM | POA: Diagnosis not present

## 2017-08-03 DIAGNOSIS — E039 Hypothyroidism, unspecified: Secondary | ICD-10-CM | POA: Diagnosis not present

## 2017-08-03 DIAGNOSIS — R5383 Other fatigue: Secondary | ICD-10-CM | POA: Diagnosis not present

## 2017-08-03 DIAGNOSIS — M8588 Other specified disorders of bone density and structure, other site: Secondary | ICD-10-CM | POA: Diagnosis not present

## 2017-08-03 DIAGNOSIS — E7800 Pure hypercholesterolemia, unspecified: Secondary | ICD-10-CM | POA: Diagnosis not present

## 2017-08-03 DIAGNOSIS — Z1159 Encounter for screening for other viral diseases: Secondary | ICD-10-CM | POA: Diagnosis not present

## 2017-08-03 DIAGNOSIS — Z0000 Encounter for general adult medical examination without abnormal findings: Secondary | ICD-10-CM | POA: Diagnosis not present

## 2017-09-18 DIAGNOSIS — Z4789 Encounter for other orthopedic aftercare: Secondary | ICD-10-CM | POA: Diagnosis not present

## 2017-09-18 DIAGNOSIS — M79671 Pain in right foot: Secondary | ICD-10-CM | POA: Diagnosis not present

## 2017-09-18 DIAGNOSIS — Z978 Presence of other specified devices: Secondary | ICD-10-CM | POA: Diagnosis not present

## 2017-09-18 DIAGNOSIS — M2041 Other hammer toe(s) (acquired), right foot: Secondary | ICD-10-CM | POA: Diagnosis not present

## 2017-10-20 DIAGNOSIS — Z978 Presence of other specified devices: Secondary | ICD-10-CM | POA: Diagnosis not present

## 2017-10-20 DIAGNOSIS — Z4789 Encounter for other orthopedic aftercare: Secondary | ICD-10-CM | POA: Diagnosis not present

## 2017-10-20 DIAGNOSIS — M79671 Pain in right foot: Secondary | ICD-10-CM | POA: Diagnosis not present

## 2017-10-20 DIAGNOSIS — M2041 Other hammer toe(s) (acquired), right foot: Secondary | ICD-10-CM | POA: Diagnosis not present

## 2017-11-02 DIAGNOSIS — W57XXXA Bitten or stung by nonvenomous insect and other nonvenomous arthropods, initial encounter: Secondary | ICD-10-CM | POA: Diagnosis not present

## 2017-11-02 DIAGNOSIS — Z6821 Body mass index (BMI) 21.0-21.9, adult: Secondary | ICD-10-CM | POA: Diagnosis not present

## 2017-11-02 DIAGNOSIS — S40869A Insect bite (nonvenomous) of unspecified upper arm, initial encounter: Secondary | ICD-10-CM | POA: Diagnosis not present

## 2017-11-06 DIAGNOSIS — E7800 Pure hypercholesterolemia, unspecified: Secondary | ICD-10-CM | POA: Diagnosis not present

## 2017-11-06 DIAGNOSIS — E039 Hypothyroidism, unspecified: Secondary | ICD-10-CM | POA: Diagnosis not present

## 2018-01-29 DIAGNOSIS — E039 Hypothyroidism, unspecified: Secondary | ICD-10-CM | POA: Diagnosis not present

## 2018-04-06 DIAGNOSIS — E039 Hypothyroidism, unspecified: Secondary | ICD-10-CM | POA: Diagnosis not present

## 2018-05-10 DIAGNOSIS — D1801 Hemangioma of skin and subcutaneous tissue: Secondary | ICD-10-CM | POA: Diagnosis not present

## 2018-05-10 DIAGNOSIS — L814 Other melanin hyperpigmentation: Secondary | ICD-10-CM | POA: Diagnosis not present

## 2018-05-10 DIAGNOSIS — D225 Melanocytic nevi of trunk: Secondary | ICD-10-CM | POA: Diagnosis not present

## 2018-05-10 DIAGNOSIS — D2371 Other benign neoplasm of skin of right lower limb, including hip: Secondary | ICD-10-CM | POA: Diagnosis not present

## 2018-05-10 DIAGNOSIS — L821 Other seborrheic keratosis: Secondary | ICD-10-CM | POA: Diagnosis not present

## 2018-05-10 DIAGNOSIS — L661 Lichen planopilaris: Secondary | ICD-10-CM | POA: Diagnosis not present

## 2018-07-11 DIAGNOSIS — E039 Hypothyroidism, unspecified: Secondary | ICD-10-CM | POA: Diagnosis not present

## 2018-08-27 DIAGNOSIS — E7800 Pure hypercholesterolemia, unspecified: Secondary | ICD-10-CM | POA: Diagnosis not present

## 2018-08-27 DIAGNOSIS — E039 Hypothyroidism, unspecified: Secondary | ICD-10-CM | POA: Diagnosis not present

## 2018-09-26 DIAGNOSIS — S0501XA Injury of conjunctiva and corneal abrasion without foreign body, right eye, initial encounter: Secondary | ICD-10-CM | POA: Diagnosis not present

## 2019-03-12 ENCOUNTER — Ambulatory Visit: Payer: Self-pay

## 2019-03-21 ENCOUNTER — Ambulatory Visit: Payer: Medicare Other | Attending: Internal Medicine

## 2019-03-21 DIAGNOSIS — Z23 Encounter for immunization: Secondary | ICD-10-CM | POA: Insufficient documentation

## 2019-03-21 NOTE — Progress Notes (Signed)
   Covid-19 Vaccination Clinic  Name:  Kiara Logan    MRN: [id number] DOB: 1950-03-03  03/21/2019  [name] was observed post Covid-19 immunization for 15 minutes without incidence. She was provided with Vaccine Information Sheet and instruction to access the V-Safe system.   [name] was instructed to call 911 with any severe reactions post vaccine: Marland Kitchen Difficulty breathing  . Swelling of your face and throat  . A fast heartbeat  . A bad rash all over your body  . Dizziness and weakness    Immunizations Administered    Name Date Dose VIS Date Route   Pfizer COVID-19 Vaccine 03/21/2019  5:07 PM 0.3 mL 01/25/2019 Intramuscular   Manufacturer: [place]   Lot: [id number]   [place]: [id number]

## 2019-03-29 ENCOUNTER — Ambulatory Visit: Payer: Self-pay

## 2019-04-16 ENCOUNTER — Ambulatory Visit: Payer: Medicare Other | Attending: Internal Medicine

## 2019-04-16 ENCOUNTER — Ambulatory Visit: Payer: Medicare Other

## 2019-04-16 DIAGNOSIS — Z23 Encounter for immunization: Secondary | ICD-10-CM | POA: Insufficient documentation

## 2019-04-16 NOTE — Progress Notes (Signed)
   Covid-19 Vaccination Clinic  Name:  Kiara Logan    MRN: [id number] DOB: [birth year]  04/16/2019  [name] was observed post Covid-19 immunization for 15 minutes without incident. She was provided with Vaccine Information Sheet and instruction to access the V-Safe system.   [name] was instructed to call 911 with any severe reactions post vaccine: Marland Kitchen Difficulty breathing  . Swelling of face and throat  . A fast heartbeat  . A bad rash all over body  . Dizziness and weakness   Immunizations Administered    Name Date Dose VIS Date Route   Pfizer COVID-19 Vaccine 04/16/2019  1:25 PM 0.3 mL 01/25/2019 Intramuscular   Manufacturer: [place]   Lot: [id number]   [place]: [id number]

## 2019-07-29 ENCOUNTER — Other Ambulatory Visit: Payer: Self-pay | Admitting: Obstetrics and Gynecology

## 2019-07-29 DIAGNOSIS — E2839 Other primary ovarian failure: Secondary | ICD-10-CM

## 2019-09-02 ENCOUNTER — Other Ambulatory Visit: Payer: Self-pay

## 2019-09-02 ENCOUNTER — Ambulatory Visit
Admission: RE | Admit: 2019-09-02 | Discharge: 2019-09-02 | Disposition: A | Discharge status: Home / Self Care | Payer: Medicare Other | Source: Ambulatory Visit | Attending: Obstetrics and Gynecology | Admitting: Obstetrics and Gynecology

## 2019-09-02 DIAGNOSIS — E2839 Other primary ovarian failure: Secondary | ICD-10-CM

## 2020-02-23 DIAGNOSIS — Z1152 Encounter for screening for COVID-19: Secondary | ICD-10-CM | POA: Diagnosis not present

## 2020-03-03 DIAGNOSIS — S52502A Unspecified fracture of the lower end of left radius, initial encounter for closed fracture: Secondary | ICD-10-CM | POA: Diagnosis not present

## 2020-03-03 DIAGNOSIS — M79642 Pain in left hand: Secondary | ICD-10-CM | POA: Diagnosis not present

## 2020-03-04 DIAGNOSIS — S52502A Unspecified fracture of the lower end of left radius, initial encounter for closed fracture: Secondary | ICD-10-CM | POA: Diagnosis not present

## 2020-03-12 DIAGNOSIS — S52502D Unspecified fracture of the lower end of left radius, subsequent encounter for closed fracture with routine healing: Secondary | ICD-10-CM | POA: Diagnosis not present

## 2020-03-23 DIAGNOSIS — S52502D Unspecified fracture of the lower end of left radius, subsequent encounter for closed fracture with routine healing: Secondary | ICD-10-CM | POA: Diagnosis not present

## 2020-04-06 DIAGNOSIS — M13842 Other specified arthritis, left hand: Secondary | ICD-10-CM | POA: Diagnosis not present

## 2020-04-06 DIAGNOSIS — S52502D Unspecified fracture of the lower end of left radius, subsequent encounter for closed fracture with routine healing: Secondary | ICD-10-CM | POA: Diagnosis not present

## 2020-04-16 DIAGNOSIS — S52502D Unspecified fracture of the lower end of left radius, subsequent encounter for closed fracture with routine healing: Secondary | ICD-10-CM | POA: Diagnosis not present

## 2020-04-16 DIAGNOSIS — M13842 Other specified arthritis, left hand: Secondary | ICD-10-CM | POA: Diagnosis not present

## 2020-04-16 DIAGNOSIS — M25532 Pain in left wrist: Secondary | ICD-10-CM | POA: Diagnosis not present

## 2020-04-30 DIAGNOSIS — S52502D Unspecified fracture of the lower end of left radius, subsequent encounter for closed fracture with routine healing: Secondary | ICD-10-CM | POA: Diagnosis not present

## 2020-05-05 DIAGNOSIS — M25532 Pain in left wrist: Secondary | ICD-10-CM | POA: Diagnosis not present

## 2020-05-11 DIAGNOSIS — M25632 Stiffness of left wrist, not elsewhere classified: Secondary | ICD-10-CM | POA: Diagnosis not present

## 2020-05-21 DIAGNOSIS — S52502D Unspecified fracture of the lower end of left radius, subsequent encounter for closed fracture with routine healing: Secondary | ICD-10-CM | POA: Diagnosis not present

## 2020-05-21 DIAGNOSIS — M13842 Other specified arthritis, left hand: Secondary | ICD-10-CM | POA: Diagnosis not present

## 2020-05-22 DIAGNOSIS — M25632 Stiffness of left wrist, not elsewhere classified: Secondary | ICD-10-CM | POA: Diagnosis not present

## 2020-06-11 DIAGNOSIS — H5213 Myopia, bilateral: Secondary | ICD-10-CM | POA: Diagnosis not present

## 2020-06-22 DIAGNOSIS — M65332 Trigger finger, left middle finger: Secondary | ICD-10-CM | POA: Diagnosis not present

## 2020-06-22 DIAGNOSIS — S52502D Unspecified fracture of the lower end of left radius, subsequent encounter for closed fracture with routine healing: Secondary | ICD-10-CM | POA: Diagnosis not present

## 2020-06-22 DIAGNOSIS — M13842 Other specified arthritis, left hand: Secondary | ICD-10-CM | POA: Diagnosis not present

## 2020-07-03 DIAGNOSIS — M859 Disorder of bone density and structure, unspecified: Secondary | ICD-10-CM | POA: Diagnosis not present

## 2020-07-03 DIAGNOSIS — E7800 Pure hypercholesterolemia, unspecified: Secondary | ICD-10-CM | POA: Diagnosis not present

## 2020-07-03 DIAGNOSIS — E039 Hypothyroidism, unspecified: Secondary | ICD-10-CM | POA: Diagnosis not present

## 2020-07-08 DIAGNOSIS — M810 Age-related osteoporosis without current pathological fracture: Secondary | ICD-10-CM | POA: Diagnosis not present

## 2020-07-08 DIAGNOSIS — E7800 Pure hypercholesterolemia, unspecified: Secondary | ICD-10-CM | POA: Diagnosis not present

## 2020-07-08 DIAGNOSIS — E039 Hypothyroidism, unspecified: Secondary | ICD-10-CM | POA: Diagnosis not present

## 2020-07-08 DIAGNOSIS — Z0000 Encounter for general adult medical examination without abnormal findings: Secondary | ICD-10-CM | POA: Diagnosis not present

## 2020-10-09 DIAGNOSIS — E7800 Pure hypercholesterolemia, unspecified: Secondary | ICD-10-CM | POA: Diagnosis not present

## 2020-10-09 DIAGNOSIS — E039 Hypothyroidism, unspecified: Secondary | ICD-10-CM | POA: Diagnosis not present

## 2020-11-23 DIAGNOSIS — R21 Rash and other nonspecific skin eruption: Secondary | ICD-10-CM | POA: Diagnosis not present

## 2020-11-26 DIAGNOSIS — Z1231 Encounter for screening mammogram for malignant neoplasm of breast: Secondary | ICD-10-CM | POA: Diagnosis not present

## 2021-01-20 DIAGNOSIS — E039 Hypothyroidism, unspecified: Secondary | ICD-10-CM | POA: Diagnosis not present

## 2021-02-04 DIAGNOSIS — N3001 Acute cystitis with hematuria: Secondary | ICD-10-CM | POA: Diagnosis not present

## 2021-04-07 DIAGNOSIS — M7741 Metatarsalgia, right foot: Secondary | ICD-10-CM | POA: Diagnosis not present

## 2021-04-08 DIAGNOSIS — I788 Other diseases of capillaries: Secondary | ICD-10-CM | POA: Diagnosis not present

## 2021-04-08 DIAGNOSIS — L821 Other seborrheic keratosis: Secondary | ICD-10-CM | POA: Diagnosis not present

## 2021-04-08 DIAGNOSIS — L309 Dermatitis, unspecified: Secondary | ICD-10-CM | POA: Diagnosis not present

## 2021-04-08 DIAGNOSIS — D1801 Hemangioma of skin and subcutaneous tissue: Secondary | ICD-10-CM | POA: Diagnosis not present

## 2021-04-08 DIAGNOSIS — D2262 Melanocytic nevi of left upper limb, including shoulder: Secondary | ICD-10-CM | POA: Diagnosis not present

## 2021-04-08 DIAGNOSIS — D2272 Melanocytic nevi of left lower limb, including hip: Secondary | ICD-10-CM | POA: Diagnosis not present

## 2021-04-08 DIAGNOSIS — D225 Melanocytic nevi of trunk: Secondary | ICD-10-CM | POA: Diagnosis not present

## 2021-04-08 DIAGNOSIS — D2371 Other benign neoplasm of skin of right lower limb, including hip: Secondary | ICD-10-CM | POA: Diagnosis not present

## 2021-04-08 DIAGNOSIS — D692 Other nonthrombocytopenic purpura: Secondary | ICD-10-CM | POA: Diagnosis not present

## 2021-04-08 DIAGNOSIS — L814 Other melanin hyperpigmentation: Secondary | ICD-10-CM | POA: Diagnosis not present

## 2021-04-08 DIAGNOSIS — L661 Lichen planopilaris: Secondary | ICD-10-CM | POA: Diagnosis not present

## 2021-05-10 ENCOUNTER — Encounter (HOSPITAL_BASED_OUTPATIENT_CLINIC_OR_DEPARTMENT_OTHER): Payer: Self-pay

## 2021-05-10 ENCOUNTER — Emergency Department (HOSPITAL_BASED_OUTPATIENT_CLINIC_OR_DEPARTMENT_OTHER): Payer: Medicare Other

## 2021-05-10 ENCOUNTER — Emergency Department (HOSPITAL_BASED_OUTPATIENT_CLINIC_OR_DEPARTMENT_OTHER)
Admission: EM | Admit: 2021-05-10 | Discharge: 2021-05-11 | Disposition: A | Discharge status: Home / Self Care | Payer: Medicare Other | Attending: Emergency Medicine | Admitting: Emergency Medicine

## 2021-05-10 ENCOUNTER — Emergency Department (HOSPITAL_BASED_OUTPATIENT_CLINIC_OR_DEPARTMENT_OTHER): Payer: Medicare Other | Admitting: Radiology

## 2021-05-10 ENCOUNTER — Other Ambulatory Visit: Payer: Self-pay

## 2021-05-10 DIAGNOSIS — M7989 Other specified soft tissue disorders: Secondary | ICD-10-CM | POA: Insufficient documentation

## 2021-05-10 DIAGNOSIS — R2240 Localized swelling, mass and lump, unspecified lower limb: Secondary | ICD-10-CM | POA: Diagnosis not present

## 2021-05-10 DIAGNOSIS — R2242 Localized swelling, mass and lump, left lower limb: Secondary | ICD-10-CM

## 2021-05-10 DIAGNOSIS — R21 Rash and other nonspecific skin eruption: Secondary | ICD-10-CM | POA: Insufficient documentation

## 2021-05-10 DIAGNOSIS — M25572 Pain in left ankle and joints of left foot: Secondary | ICD-10-CM | POA: Diagnosis not present

## 2021-05-10 NOTE — ED Triage Notes (Signed)
Patient here POV from Home with Left Ankle Swelling/Pain. ? ?Patient noted Left Ankle to be swollen and painful for approximately 3-4 days. Also notes bruising to Lower Left Leg. ? ?No Acute Trauma recalled. No Pain to Leg. Painful to Ambulate.  ? ?NAD Noted during Triage. A&Ox4. GCS 15. Ambulatory.  ?

## 2021-05-10 NOTE — Discharge Instructions (Addendum)
It was a pleasure caring for you today in the emergency department. ° °Please return to the emergency department for any worsening or worrisome symptoms. ° ° °

## 2021-05-10 NOTE — ED Notes (Signed)
Pt reports 3-4 days of left ankle swelling and pain, denies any injury ?

## 2021-05-10 NOTE — ED Provider Notes (Signed)
?[facility] ?Provider Note ? ? ?CSN: [id number] ?Arrival date & time: 05/10/21  1835 ? ?  ? ?History ? ?Chief Complaint  ?Patient presents with  ? Ankle Pain  ? ? ?Kiara Logan is a 71 y.o. female. ? ?This is a 71 y.o. female with significant medical history as below, including left elbow fracture, left breast biopsy who presents to the ED with complaint of left ankle swelling. ? ?Location: Left ankle, left calf ?Duration: 3 to 4 days ?Onset: Gradual ?Timing: Constant ?Description: Not associated with discomfort, ?Severity: N/A ?Exacerbating/Alleviating Factors: N/A ?Associated Symptoms: Rash noted to affected extremity.  Discomfort on palpation. ?Pertinent Negatives: No fevers, chills, nausea or vomiting, no trauma, no history of DVT or PE, no recent immobilization or hospitalization.  No oral hormone use. ? ?Context: Patient with swelling to left ankle for the past 3 to 4 days.  Not associated with trauma.  Swelling did improve some with compression stocking/elevation ? ? ? ?History reviewed. No pertinent past medical history. ? ?Past Surgical History: ?1997: BREAST BIOPSY ?    Comment:  benign, left ?1956: compund fracture ?    Comment:  left elbow ?1974: EYE SURGERY ?    Comment:  left eye for torn retina ?1973: WISDOM TOOTH EXTRACTION  ? ? ?The history is provided by the patient and the spouse. No language interpreter was used.  ?Ankle Pain ?Associated symptoms: no fever   ? ?  ? ?Home Medications ?Prior to Admission medications   ?Medication Sig Start Date End Date Taking? Authorizing Provider  ?Calcium Carbonate-Vitamin D (CALCIUM + D PO) Take by mouth 2 (two) times daily.    [provider]  ?cholecalciferol (VITAMIN D) 1000 UNITS tablet Take 1,000 Units by mouth 2 (two) times daily.    [provider]  ?Coenzyme Q10 (COQ10 PO) Take by mouth daily.    [provider]  ?fluticasone Asencion Islam) 50 MCG/ACT nasal spray  04/06/11   [provider]   ?glucosamine-chondroitin 500-400 MG tablet Take 1 tablet by mouth daily.    [provider]  ?Multiple Vitamin (MULTIVITAMIN) tablet Take 1 tablet by mouth daily.    [provider]  ?UNABLE TO FIND 90 mg daily. Med Name: compunded thyroid med    [provider]  ?vitamin C (ASCORBIC ACID) 500 MG tablet Take 500 mg by mouth 2 (two) times daily.    [provider]  ?vitamin E (VITAMIN E) 400 UNIT capsule Take 400 Units by mouth daily.    [provider]  ?   ? ?Allergies    ?Patient has no known allergies.   ? ?Review of Systems   ?Review of Systems  ?Constitutional:  Negative for chills and fever.  ?HENT:  Negative for facial swelling and trouble swallowing.   ?Eyes:  Negative for photophobia and visual disturbance.  ?Respiratory:  Negative for cough and shortness of breath.   ?Cardiovascular:  Positive for leg swelling. Negative for chest pain and palpitations.  ?Gastrointestinal:  Negative for abdominal pain, nausea and vomiting.  ?Endocrine: Negative for polydipsia and polyuria.  ?Genitourinary:  Negative for difficulty urinating and hematuria.  ?Musculoskeletal:  Negative for gait problem and joint swelling.  ?Skin:  Negative for pallor and rash.  ?Neurological:  Negative for syncope and headaches.  ?Psychiatric/Behavioral:  Negative for agitation and confusion.   ? ?Physical Exam ?Updated Vital Signs ?BP 114/70   Pulse 64   Temp 98.4 ?F (36.9 ?C)   Resp 20   Ht  $'5\' 6"'v$  (1.676 m)   Wt 59 kg   SpO2 100%   BMI 20.99 kg/m?  ?Physical Exam ?Vitals and nursing note reviewed.  ?Constitutional:   ?   General: She is not in acute distress. ?   Appearance: Normal appearance. She is not ill-appearing, toxic-appearing or diaphoretic.  ?HENT:  ?   Head: Normocephalic and atraumatic.  ?   Right Ear: External ear normal.  ?   Left Ear: External ear normal.  ?   Nose: Nose normal.  ?   Mouth/Throat:  ?   Mouth: Mucous membranes are moist.  ?Eyes:  ?   General: No scleral  icterus.    ?   Right eye: No discharge.     ?   Left eye: No discharge.  ?Cardiovascular:  ?   Rate and Rhythm: Normal rate and regular rhythm.  ?   Pulses: Normal pulses.  ?   Heart sounds: Normal heart sounds.  ?Pulmonary:  ?   Effort: Pulmonary effort is normal. No respiratory distress.  ?   Breath sounds: Normal breath sounds.  ?Abdominal:  ?   General: Abdomen is flat.  ?   Tenderness: There is no abdominal tenderness.  ?Musculoskeletal:     ?   General: Normal range of motion.  ?   Cervical back: Normal range of motion.  ?   Right lower leg: No edema.  ?   Left lower leg: Edema (1+ pitting to mid calf) present.  ?   Comments: She has left pedal edema and left ankle edema.  DP pulses are equal symmetric bilateral.  No obvious laceration or wound.  Not erythematous.  No abscess appreciated ? ?Mild TTP to area of swelling  ?Skin: ?   General: Skin is warm and dry.  ?   Capillary Refill: Capillary refill takes less than 2 seconds.  ?Neurological:  ?   Mental Status: She is alert.  ?Psychiatric:     ?   Mood and Affect: Mood normal.     ?   Behavior: Behavior normal.  ? ? ?ED Results / Procedures / Treatments   ?Labs ?(all labs ordered are listed, but only abnormal results are displayed) ?Labs Reviewed - No data to display ? ?EKG ?None ? ?Radiology ?DG Ankle Complete Left ? ?Result Date: 05/10/2021 ?CLINICAL DATA:  Left ankle pain. EXAM: LEFT ANKLE COMPLETE - 3+ VIEW COMPARISON:  None. FINDINGS: There is no acute fracture or dislocation. The bones are osteopenic. The ankle mortise is intact. Fixation screws in the proximal first metatarsal. Mild diffuse subcutaneous edema. IMPRESSION: 1. No acute fracture or dislocation. 2. Osteopenia. Electronically Signed   By: Anner Crete M.D.   On: 05/10/2021 19:48  ? ?US Venous Img Lower Unilateral Left ? ?Result Date: 05/10/2021 ?CLINICAL DATA:  MEDIAL LEFT ankle pain and swelling. EXAM: LEFT LOWER EXTREMITY VENOUS DOPPLER ULTRASOUND TECHNIQUE: Octavius Shin-scale sonography with  compression, as well as color and duplex ultrasound, were performed to evaluate the deep venous system(s) from the level of the common femoral vein through the popliteal and proximal calf veins. COMPARISON:  None. FINDINGS: VENOUS Normal compressibility of the common femoral, superficial femoral, and popliteal veins, as well as the visualized calf veins. Visualized portions of profunda femoral vein and great saphenous vein unremarkable. No filling defects to suggest DVT on grayscale or color Doppler imaging. Doppler waveforms show normal direction of venous flow, normal respiratory plasticity and response to augmentation. Limited views of the contralateral common femoral vein are unremarkable. OTHER  None. Limitations: Fluid is seen along the medial aspect of the left ankle, as per the ultrasound technologist. IMPRESSION: No evidence of LEFT lower extremity DVT. Electronically Signed   By: Virgina Norfolk M.D.   On: 05/10/2021 23:49   ? ?Procedures ?Procedures  ? ? ?Medications Ordered in ED ?Medications - No data to display ? ?ED Course/ Medical Decision Making/ A&P ?  ?                        ?Medical Decision Making ?Amount and/or Complexity of Data Reviewed ?Radiology: ordered. ? ? ?Initial Impression and Ddx ?This patient presents to the Emergency Department for the above complaint. This involves an extensive number of treatment options and is a complaint that carries with it a high risk of complications and morbidity. Vital signs were reviewed.  ? ?Serious etiologies considered. Ddx includes but is not limited to: Fracture, MSK, DVT, cellulitis, infectious ? ?Patient PMH that increases complexity of ED encounter: None ? ?Social determinants of health include none ? ?Additional history obtained from spouse ? ?Previous records obtained and reviewed  ? ?Interpretation of Diagnostics ?Labs & imaging results that were available during my care of the patient were visualized by me and considered in my medical  decision making. ?  ?I ordered imaging studies which included ankle x-ray left, duplex left and I visualized the imaging and I agree with radiologist interpretation.  No acute fracture on the x-ray; duplex without DVT

## 2021-05-11 ENCOUNTER — Encounter (HOSPITAL_BASED_OUTPATIENT_CLINIC_OR_DEPARTMENT_OTHER): Payer: Self-pay | Admitting: Emergency Medicine

## 2021-05-13 DIAGNOSIS — L280 Lichen simplex chronicus: Secondary | ICD-10-CM | POA: Diagnosis not present

## 2021-05-25 ENCOUNTER — Encounter: Payer: Self-pay | Admitting: Internal Medicine

## 2021-06-17 ENCOUNTER — Ambulatory Visit (AMBULATORY_SURGERY_CENTER): Payer: Medicare Other

## 2021-06-17 VITALS — Ht 66.0 in | Wt 132.0 lb

## 2021-06-17 DIAGNOSIS — Z86010 Personal history of colonic polyps: Secondary | ICD-10-CM

## 2021-06-17 MED ORDER — NA SULFATE-K SULFATE-MG SULF 17.5-3.13-1.6 GM/177ML PO SOLN: 1.0000 | Freq: Once | ORAL | 0 refills | Status: AC

## 2021-06-17 NOTE — Progress Notes (Signed)
No egg or soy allergy known to patient  ?No issues known to pt with past sedation with any surgeries or procedures ?Patient denies ever being told they had issues or difficulty with intubation  ?No FH of Malignant Hyperthermia ?Pt is not on diet pills ?Pt is not on home 02  ?Pt is not on blood thinners  ?Pt denies issues with constipation at this time; ?No A fib or A flutter ?NO PA's for preps discussed with pt in PV today  ?Discussed with pt there will be an out-of-pocket cost for prep and that varies from $0 to 70 + dollars - pt verbalized understanding  ?Pt instructed to use Singlecare.com or GoodRx for a price reduction on prep  ?PV completed over the phone. Pt verified name, DOB, address and insurance during PV today.  ?Pt mailed instruction packet with copy of consent form to read and not return, and instructions.  ?Pt encouraged to call with questions or issues.  ? ?Insurance confirmed with pt at [facility] today  ? ?

## 2021-06-24 ENCOUNTER — Telehealth: Payer: Self-pay | Admitting: Internal Medicine

## 2021-06-24 DIAGNOSIS — H524 Presbyopia: Secondary | ICD-10-CM | POA: Diagnosis not present

## 2021-06-24 DIAGNOSIS — Z860100 Personal history of colon polyps, unspecified: Secondary | ICD-10-CM

## 2021-06-24 DIAGNOSIS — H52222 Regular astigmatism, left eye: Secondary | ICD-10-CM | POA: Diagnosis not present

## 2021-06-24 DIAGNOSIS — H2513 Age-related nuclear cataract, bilateral: Secondary | ICD-10-CM | POA: Diagnosis not present

## 2021-06-24 MED ORDER — NA SULFATE-K SULFATE-MG SULF 17.5-3.13-1.6 GM/177ML PO SOLN: 1.0000 | ORAL | 0 refills | Status: DC

## 2021-06-24 NOTE — Telephone Encounter (Signed)
Suprep rx sent to [facility] per pt's request. Called and cancelled Suprep rx at [facility].  ?

## 2021-06-24 NOTE — Telephone Encounter (Signed)
Patient called requesting her prescription for her prep be called into a different pharmacy.  She'd like [facility] [address] in [place].  Thank you. ?

## 2021-07-09 ENCOUNTER — Encounter: Payer: Self-pay | Admitting: Internal Medicine

## 2021-07-14 ENCOUNTER — Encounter: Payer: Self-pay | Admitting: Internal Medicine

## 2021-07-14 ENCOUNTER — Ambulatory Visit (AMBULATORY_SURGERY_CENTER): Payer: Medicare Other | Admitting: Internal Medicine

## 2021-07-14 VITALS — BP 107/74 | HR 59 | Temp 97.5°F | Resp 13 | Ht 66.0 in | Wt 132.0 lb

## 2021-07-14 DIAGNOSIS — D122 Benign neoplasm of ascending colon: Secondary | ICD-10-CM | POA: Diagnosis not present

## 2021-07-14 DIAGNOSIS — Z1211 Encounter for screening for malignant neoplasm of colon: Secondary | ICD-10-CM | POA: Diagnosis not present

## 2021-07-14 DIAGNOSIS — K635 Polyp of colon: Secondary | ICD-10-CM | POA: Diagnosis not present

## 2021-07-14 MED ORDER — SODIUM CHLORIDE 0.9 % IV SOLN: 500.0000 mL | Freq: Once | INTRAVENOUS | Status: DC

## 2021-07-14 NOTE — Patient Instructions (Signed)
Handout given on polyps.  Await pathology results.  Resume previous diet and continue current medications.    YOU HAD AN ENDOSCOPIC PROCEDURE TODAY AT [facility]:   Refer to the procedure report that was given to you for any specific questions about what was found during the examination.  If the procedure report does not answer your questions, please call your gastroenterologist to clarify.  If you requested that your care partner not be given the details of your procedure findings, then the procedure report has been included in a sealed envelope for you to review at your convenience later.  YOU SHOULD EXPECT: Some feelings of bloating in the abdomen. Passage of more gas than usual.  Walking can help get rid of the air that was put into your GI tract during the procedure and reduce the bloating. If you had a lower endoscopy (such as a colonoscopy or flexible sigmoidoscopy) you may notice spotting of blood in your stool or on the toilet paper. If you underwent a bowel prep for your procedure, you may not have a normal bowel movement for a few days.  Please Note:  You might notice some irritation and congestion in your nose or some drainage.  This is from the oxygen used during your procedure.  There is no need for concern and it should clear up in a day or so.  SYMPTOMS TO REPORT IMMEDIATELY:  Following lower endoscopy (colonoscopy or flexible sigmoidoscopy):  Excessive amounts of blood in the stool  Significant tenderness or worsening of abdominal pains  Swelling of the abdomen that is new, acute  Fever of 100F or higher   For urgent or emergent issues, a gastroenterologist can be reached at any hour by calling [phone]. Do not use MyChart messaging for urgent concerns.    DIET:  We do recommend a small meal at first, but then you may proceed to your regular diet.  Drink plenty of fluids but you should avoid alcoholic beverages for 24 hours.  ACTIVITY:  You should plan  to take it easy for the rest of today and you should NOT DRIVE or use heavy machinery until tomorrow (because of the sedation medicines used during the test).    FOLLOW UP: Our staff will call the number listed on your records 48-72 hours following your procedure to check on you and address any questions or concerns that you may have regarding the information given to you following your procedure. If we do not reach you, we will leave a message.  We will attempt to reach you two times.  During this call, we will ask if you have developed any symptoms of COVID 19. If you develop any symptoms (ie: fever, flu-like symptoms, shortness of breath, cough etc.) before then, please call [phone].  If you test positive for Covid 19 in the 2 weeks post procedure, please call and report this information to [place].    If any biopsies were taken you will be contacted by phone or by letter within the next 1-3 weeks.  Please call us at [phone] if you have not heard about the biopsies in 3 weeks.    SIGNATURES/CONFIDENTIALITY: You and/or your care partner have signed paperwork which will be entered into your electronic medical record.  These signatures attest to the fact that that the information above on your After Visit Summary has been reviewed and is understood.  Full responsibility of the confidentiality of this discharge information lies with you and/or your care-partner.

## 2021-07-14 NOTE — Progress Notes (Signed)
Report to PACU, RN, vss, BBS= Clear.  

## 2021-07-14 NOTE — Progress Notes (Signed)
VS by DT  Pt's states no medical or surgical changes since previsit or office visit.  

## 2021-07-14 NOTE — Progress Notes (Signed)
HISTORY OF PRESENT ILLNESS:  Kiara Logan is a 71 y.o. female presents today for screening colonoscopy.  Prior examinations 2007 and 2013 were negative for neoplasia.  No complaints  REVIEW OF SYSTEMS:  All non-GI ROS negative. Past Medical History:  Diagnosis Date   Cataract    06/17/2021-not a surgical candidate at this time;   Thyroid disease    on meds    Past Surgical History:  Procedure Laterality Date   BREAST BIOPSY Left 02/15/1995   benign   BUNIONECTOMY Left 2015   BUNIONECTOMY Right 2016   COLONOSCOPY  2013   JP-movi-(exc)   COLONOSCOPY  2007   polyps   compund fracture  02/14/1954   left elbow   EYE SURGERY  02/15/1972   left eye for torn retina   WISDOM TOOTH EXTRACTION  02/15/1971    Social History Karina CHANCE KARAM  reports that she has never smoked. She has never used smokeless tobacco. She reports that she does not currently use alcohol. She reports that she does not use drugs.  family history includes Colon cancer (age of onset: 50) in her father; Colon polyps (age of onset: 47) in her father.  Allergies  Allergen Reactions   Tape Rash       PHYSICAL EXAMINATION:  Vital signs: BP (!) 100/52   Pulse 70   Temp (!) 97.5 F (36.4 C)   Ht '5\' 6"'$  (1.676 m)   Wt 132 lb (59.9 kg)   SpO2 100%   BMI 21.31 kg/m  General: Well-developed, well-nourished, no acute distress HEENT: Sclerae are anicteric, conjunctiva pink. Oral mucosa intact Lungs: Clear Heart: Regular Abdomen: soft, nontender, nondistended, no obvious ascites, no peritoneal signs, normal bowel sounds. No organomegaly. Extremities: No edema Psychiatric: alert and oriented x3. Cooperative     ASSESSMENT:  Colon cancer screening.  Negative prior exams as described   PLAN:   Screening colonoscopy

## 2021-07-14 NOTE — Progress Notes (Signed)
Called to room to assist during endoscopic procedure.  Patient ID and intended procedure confirmed with present staff. Received instructions for my participation in the procedure from the performing physician.  

## 2021-07-14 NOTE — Op Note (Signed)
[facility] Patient Name: Kiara Logan Procedure Date: 07/14/2021 2:37 PM MRN: [id number] Endoscopist: Docia Chuck. Henrene Pastor , MD Age: 71 Referring MD:  Date of Birth: 21-Jul-1950 Gender: Female Account #: [id number] Procedure:                Colonoscopy with cold snare polypectomy x 1 Indications:              Screening for colorectal malignant neoplasm Medicines:                Monitored Anesthesia Care Procedure:                Pre-Anesthesia Assessment:                           - Prior to the procedure, a History and Physical                            was performed, and patient medications and                            allergies were reviewed. The patient's tolerance of                            previous anesthesia was also reviewed. The risks                            and benefits of the procedure and the sedation                            options and risks were discussed with the patient.                            All questions were answered, and informed consent                            was obtained. Prior Anticoagulants: The patient has                            taken no previous anticoagulant or antiplatelet                            agents. ASA Grade Assessment: II - A patient with                            mild systemic disease. After reviewing the risks                            and benefits, the patient was deemed in                            satisfactory condition to undergo the procedure.                           After obtaining informed consent, the colonoscope  was passed under direct vision. Throughout the                            procedure, the patient's blood pressure, pulse, and                            oxygen saturations were monitored continuously. The                            Olympus CF-HQ190L (Serial# [id number]) Colonoscope was                            introduced through the anus and advanced to the the                             cecum, identified by appendiceal orifice and                            ileocecal valve. The ileocecal valve, appendiceal                            orifice, and rectum were photographed. The quality                            of the bowel preparation was excellent. The                            colonoscopy was performed without difficulty. The                            patient tolerated the procedure well. The bowel                            preparation used was SUPREP via split dose                            instruction. Scope In: 2:56:51 PM Scope Out: 3:12:05 PM Scope Withdrawal Time: 0 hours 12 minutes 14 seconds  Total Procedure Duration: 0 hours 15 minutes 14 seconds  Findings:                 A 4 mm polyp was found in the ascending colon. The                            polyp was removed with a cold snare. Resection and                            retrieval were complete.                           The exam was otherwise without abnormality on                            direct and retroflexion views. Complications:  No immediate complications. Estimated blood loss:                            None. Estimated Blood Loss:     Estimated blood loss: none. Impression:               - One 4 mm polyp in the ascending colon, removed                            with a cold snare. Resected and retrieved.                           - The examination was otherwise normal on direct                            and retroflexion views. Recommendation:           - Repeat colonoscopy is not recommended for                            surveillance.                           - Patient has a contact number available for                            emergencies. The signs and symptoms of potential                            delayed complications were discussed with the                            patient. Return to normal activities tomorrow.                            Written  discharge instructions were provided to the                            patient.                           - Resume previous diet.                           - Continue present medications.                           - Await pathology results. Docia Chuck. Henrene Pastor, MD 07/14/2021 3:20:43 PM This report has been signed electronically.

## 2021-07-15 ENCOUNTER — Telehealth: Payer: Self-pay

## 2021-07-15 NOTE — Telephone Encounter (Signed)
  Follow up Call-     07/14/2021    1:23 PM  Call back number  Post procedure Call Back phone  # [phone]  Permission to leave phone message Yes     Patient questions:  Do you have a fever, pain , or abdominal swelling? No. Pain Score  0 *  Have you tolerated food without any problems? Yes.    Have you been able to return to your normal activities? Yes.    Do you have any questions about your discharge instructions: Diet   No. Medications  No. Follow up visit  No.  Do you have questions or concerns about your Care? No.  Actions: * If pain score is 4 or above: No action needed, pain <4.

## 2021-07-20 ENCOUNTER — Encounter: Payer: Self-pay | Admitting: Internal Medicine

## 2021-07-21 DIAGNOSIS — E039 Hypothyroidism, unspecified: Secondary | ICD-10-CM | POA: Diagnosis not present

## 2021-07-21 DIAGNOSIS — M810 Age-related osteoporosis without current pathological fracture: Secondary | ICD-10-CM | POA: Diagnosis not present

## 2021-07-26 DIAGNOSIS — E039 Hypothyroidism, unspecified: Secondary | ICD-10-CM | POA: Diagnosis not present

## 2021-07-26 DIAGNOSIS — E7800 Pure hypercholesterolemia, unspecified: Secondary | ICD-10-CM | POA: Diagnosis not present

## 2021-07-26 DIAGNOSIS — Z0000 Encounter for general adult medical examination without abnormal findings: Secondary | ICD-10-CM | POA: Diagnosis not present

## 2021-07-26 DIAGNOSIS — M810 Age-related osteoporosis without current pathological fracture: Secondary | ICD-10-CM | POA: Diagnosis not present

## 2021-10-25 DIAGNOSIS — E039 Hypothyroidism, unspecified: Secondary | ICD-10-CM | POA: Diagnosis not present

## 2021-12-24 DIAGNOSIS — L661 Lichen planopilaris: Secondary | ICD-10-CM | POA: Diagnosis not present

## 2022-01-26 DIAGNOSIS — M25522 Pain in left elbow: Secondary | ICD-10-CM | POA: Diagnosis not present

## 2022-01-26 DIAGNOSIS — M25532 Pain in left wrist: Secondary | ICD-10-CM | POA: Diagnosis not present

## 2022-01-26 DIAGNOSIS — M25521 Pain in right elbow: Secondary | ICD-10-CM | POA: Diagnosis not present

## 2022-01-26 DIAGNOSIS — M25531 Pain in right wrist: Secondary | ICD-10-CM | POA: Diagnosis not present

## 2022-01-26 DIAGNOSIS — S52502D Unspecified fracture of the lower end of left radius, subsequent encounter for closed fracture with routine healing: Secondary | ICD-10-CM | POA: Diagnosis not present

## 2022-01-26 DIAGNOSIS — M79642 Pain in left hand: Secondary | ICD-10-CM | POA: Diagnosis not present

## 2022-01-26 DIAGNOSIS — Z01818 Encounter for other preprocedural examination: Secondary | ICD-10-CM | POA: Diagnosis not present

## 2022-01-26 DIAGNOSIS — M13842 Other specified arthritis, left hand: Secondary | ICD-10-CM | POA: Diagnosis not present

## 2022-02-03 DIAGNOSIS — U071 COVID-19: Secondary | ICD-10-CM | POA: Diagnosis not present

## 2022-02-15 DIAGNOSIS — E039 Hypothyroidism, unspecified: Secondary | ICD-10-CM | POA: Diagnosis not present

## 2022-02-23 DIAGNOSIS — M25531 Pain in right wrist: Secondary | ICD-10-CM | POA: Diagnosis not present

## 2022-02-23 DIAGNOSIS — M13842 Other specified arthritis, left hand: Secondary | ICD-10-CM | POA: Diagnosis not present

## 2022-02-23 DIAGNOSIS — E039 Hypothyroidism, unspecified: Secondary | ICD-10-CM | POA: Diagnosis not present

## 2022-02-23 DIAGNOSIS — Z6823 Body mass index (BMI) 23.0-23.9, adult: Secondary | ICD-10-CM | POA: Diagnosis not present

## 2022-02-23 DIAGNOSIS — R768 Other specified abnormal immunological findings in serum: Secondary | ICD-10-CM | POA: Diagnosis not present

## 2022-02-23 DIAGNOSIS — M2550 Pain in unspecified joint: Secondary | ICD-10-CM | POA: Diagnosis not present

## 2022-02-23 DIAGNOSIS — S52502D Unspecified fracture of the lower end of left radius, subsequent encounter for closed fracture with routine healing: Secondary | ICD-10-CM | POA: Diagnosis not present

## 2022-03-08 DIAGNOSIS — M5442 Lumbago with sciatica, left side: Secondary | ICD-10-CM | POA: Diagnosis not present

## 2022-03-08 DIAGNOSIS — M9903 Segmental and somatic dysfunction of lumbar region: Secondary | ICD-10-CM | POA: Diagnosis not present

## 2022-03-10 DIAGNOSIS — M9903 Segmental and somatic dysfunction of lumbar region: Secondary | ICD-10-CM | POA: Diagnosis not present

## 2022-03-10 DIAGNOSIS — M5442 Lumbago with sciatica, left side: Secondary | ICD-10-CM | POA: Diagnosis not present

## 2022-03-15 DIAGNOSIS — M5442 Lumbago with sciatica, left side: Secondary | ICD-10-CM | POA: Diagnosis not present

## 2022-03-15 DIAGNOSIS — M9903 Segmental and somatic dysfunction of lumbar region: Secondary | ICD-10-CM | POA: Diagnosis not present

## 2022-03-17 DIAGNOSIS — Z1231 Encounter for screening mammogram for malignant neoplasm of breast: Secondary | ICD-10-CM | POA: Diagnosis not present

## 2022-03-17 DIAGNOSIS — Z01419 Encounter for gynecological examination (general) (routine) without abnormal findings: Secondary | ICD-10-CM | POA: Diagnosis not present

## 2022-03-17 DIAGNOSIS — M9903 Segmental and somatic dysfunction of lumbar region: Secondary | ICD-10-CM | POA: Diagnosis not present

## 2022-03-17 DIAGNOSIS — M5442 Lumbago with sciatica, left side: Secondary | ICD-10-CM | POA: Diagnosis not present

## 2022-03-24 DIAGNOSIS — M9903 Segmental and somatic dysfunction of lumbar region: Secondary | ICD-10-CM | POA: Diagnosis not present

## 2022-03-24 DIAGNOSIS — M5442 Lumbago with sciatica, left side: Secondary | ICD-10-CM | POA: Diagnosis not present

## 2022-03-30 DIAGNOSIS — M5442 Lumbago with sciatica, left side: Secondary | ICD-10-CM | POA: Diagnosis not present

## 2022-03-30 DIAGNOSIS — M9903 Segmental and somatic dysfunction of lumbar region: Secondary | ICD-10-CM | POA: Diagnosis not present

## 2022-04-13 DIAGNOSIS — M9903 Segmental and somatic dysfunction of lumbar region: Secondary | ICD-10-CM | POA: Diagnosis not present

## 2022-04-13 DIAGNOSIS — M5442 Lumbago with sciatica, left side: Secondary | ICD-10-CM | POA: Diagnosis not present

## 2022-05-10 DIAGNOSIS — D2272 Melanocytic nevi of left lower limb, including hip: Secondary | ICD-10-CM | POA: Diagnosis not present

## 2022-05-10 DIAGNOSIS — D2371 Other benign neoplasm of skin of right lower limb, including hip: Secondary | ICD-10-CM | POA: Diagnosis not present

## 2022-05-10 DIAGNOSIS — L661 Lichen planopilaris: Secondary | ICD-10-CM | POA: Diagnosis not present

## 2022-05-10 DIAGNOSIS — L821 Other seborrheic keratosis: Secondary | ICD-10-CM | POA: Diagnosis not present

## 2022-05-17 DIAGNOSIS — M2550 Pain in unspecified joint: Secondary | ICD-10-CM | POA: Diagnosis not present

## 2022-05-17 DIAGNOSIS — R768 Other specified abnormal immunological findings in serum: Secondary | ICD-10-CM | POA: Diagnosis not present

## 2022-05-17 DIAGNOSIS — E039 Hypothyroidism, unspecified: Secondary | ICD-10-CM | POA: Diagnosis not present

## 2022-06-09 DIAGNOSIS — M1712 Unilateral primary osteoarthritis, left knee: Secondary | ICD-10-CM | POA: Diagnosis not present

## 2022-07-21 DIAGNOSIS — K080 Exfoliation of teeth due to systemic causes: Secondary | ICD-10-CM | POA: Diagnosis not present

## 2022-07-28 DIAGNOSIS — Z0000 Encounter for general adult medical examination without abnormal findings: Secondary | ICD-10-CM | POA: Diagnosis not present

## 2022-07-28 DIAGNOSIS — Z23 Encounter for immunization: Secondary | ICD-10-CM | POA: Diagnosis not present

## 2022-08-02 ENCOUNTER — Other Ambulatory Visit: Payer: Self-pay | Admitting: Family Medicine

## 2022-08-02 DIAGNOSIS — I6523 Occlusion and stenosis of bilateral carotid arteries: Secondary | ICD-10-CM | POA: Diagnosis not present

## 2022-08-02 DIAGNOSIS — E7800 Pure hypercholesterolemia, unspecified: Secondary | ICD-10-CM | POA: Diagnosis not present

## 2022-08-02 DIAGNOSIS — E039 Hypothyroidism, unspecified: Secondary | ICD-10-CM | POA: Diagnosis not present

## 2022-08-02 DIAGNOSIS — M810 Age-related osteoporosis without current pathological fracture: Secondary | ICD-10-CM | POA: Diagnosis not present

## 2022-08-07 DIAGNOSIS — H1032 Unspecified acute conjunctivitis, left eye: Secondary | ICD-10-CM | POA: Diagnosis not present

## 2022-08-07 DIAGNOSIS — H5789 Other specified disorders of eye and adnexa: Secondary | ICD-10-CM | POA: Diagnosis not present

## 2022-08-24 ENCOUNTER — Ambulatory Visit
Admission: RE | Admit: 2022-08-24 | Discharge: 2022-08-24 | Disposition: A | Discharge status: Home / Self Care | Payer: Medicare Other | Source: Ambulatory Visit | Attending: Family Medicine | Admitting: Family Medicine

## 2022-08-24 DIAGNOSIS — I6523 Occlusion and stenosis of bilateral carotid arteries: Secondary | ICD-10-CM | POA: Diagnosis not present

## 2022-08-31 DIAGNOSIS — H524 Presbyopia: Secondary | ICD-10-CM | POA: Diagnosis not present

## 2022-10-01 DIAGNOSIS — M79662 Pain in left lower leg: Secondary | ICD-10-CM | POA: Diagnosis not present

## 2022-10-01 DIAGNOSIS — M79604 Pain in right leg: Secondary | ICD-10-CM | POA: Diagnosis not present

## 2022-10-01 DIAGNOSIS — M79605 Pain in left leg: Secondary | ICD-10-CM | POA: Diagnosis not present

## 2022-10-01 DIAGNOSIS — M79661 Pain in right lower leg: Secondary | ICD-10-CM | POA: Diagnosis not present

## 2022-10-03 ENCOUNTER — Ambulatory Visit (HOSPITAL_COMMUNITY)
Admission: RE | Admit: 2022-10-03 | Discharge: 2022-10-03 | Disposition: A | Discharge status: Home / Self Care | Payer: Medicare Other | Source: Ambulatory Visit | Attending: Cardiology | Admitting: Cardiology

## 2022-10-03 ENCOUNTER — Other Ambulatory Visit (HOSPITAL_COMMUNITY): Payer: Self-pay | Admitting: Student

## 2022-10-03 ENCOUNTER — Other Ambulatory Visit (HOSPITAL_COMMUNITY): Payer: Self-pay | Admitting: Medical

## 2022-10-03 DIAGNOSIS — M79605 Pain in left leg: Secondary | ICD-10-CM

## 2022-10-03 DIAGNOSIS — M7989 Other specified soft tissue disorders: Secondary | ICD-10-CM | POA: Insufficient documentation

## 2022-10-13 ENCOUNTER — Other Ambulatory Visit: Payer: Self-pay | Admitting: Family Medicine

## 2022-10-13 DIAGNOSIS — M8580 Other specified disorders of bone density and structure, unspecified site: Secondary | ICD-10-CM

## 2022-10-19 DIAGNOSIS — M79605 Pain in left leg: Secondary | ICD-10-CM | POA: Diagnosis not present

## 2022-10-25 DIAGNOSIS — M79605 Pain in left leg: Secondary | ICD-10-CM | POA: Diagnosis not present

## 2022-10-27 DIAGNOSIS — M79605 Pain in left leg: Secondary | ICD-10-CM | POA: Diagnosis not present

## 2022-11-01 DIAGNOSIS — M79605 Pain in left leg: Secondary | ICD-10-CM | POA: Diagnosis not present

## 2022-11-03 DIAGNOSIS — M25562 Pain in left knee: Secondary | ICD-10-CM | POA: Diagnosis not present

## 2022-11-07 DIAGNOSIS — M79605 Pain in left leg: Secondary | ICD-10-CM | POA: Diagnosis not present

## 2022-11-17 DIAGNOSIS — M1712 Unilateral primary osteoarthritis, left knee: Secondary | ICD-10-CM | POA: Diagnosis not present

## 2022-11-17 DIAGNOSIS — M79605 Pain in left leg: Secondary | ICD-10-CM | POA: Diagnosis not present

## 2022-11-18 DIAGNOSIS — M79605 Pain in left leg: Secondary | ICD-10-CM | POA: Diagnosis not present

## 2022-12-05 DIAGNOSIS — K080 Exfoliation of teeth due to systemic causes: Secondary | ICD-10-CM | POA: Diagnosis not present

## 2022-12-06 DIAGNOSIS — K080 Exfoliation of teeth due to systemic causes: Secondary | ICD-10-CM | POA: Diagnosis not present

## 2022-12-14 DIAGNOSIS — K080 Exfoliation of teeth due to systemic causes: Secondary | ICD-10-CM | POA: Diagnosis not present

## 2022-12-19 IMAGING — DX DG ANKLE COMPLETE 3+V*L*
3 series · 3 of 3 positions shown · non-contrast
Comparison: None.

CLINICAL DATA: Left ankle pain.

EXAM:
LEFT ANKLE COMPLETE - 3+ VIEW

[ankle ap]
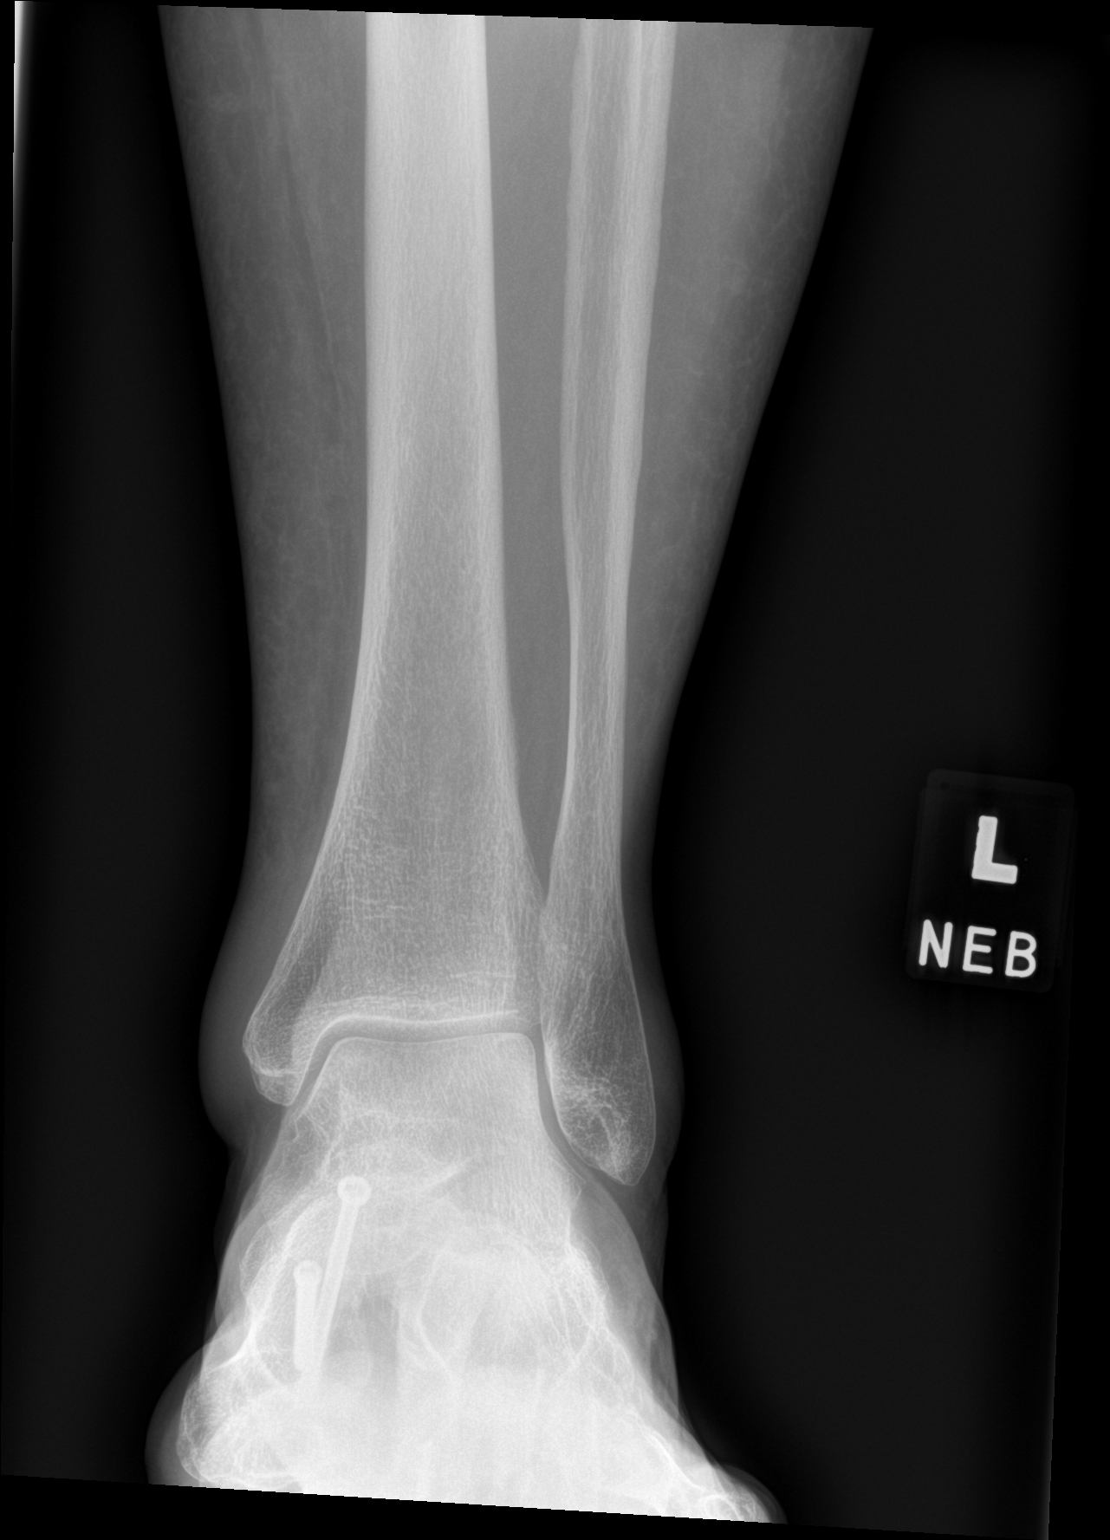

[ankle obl]
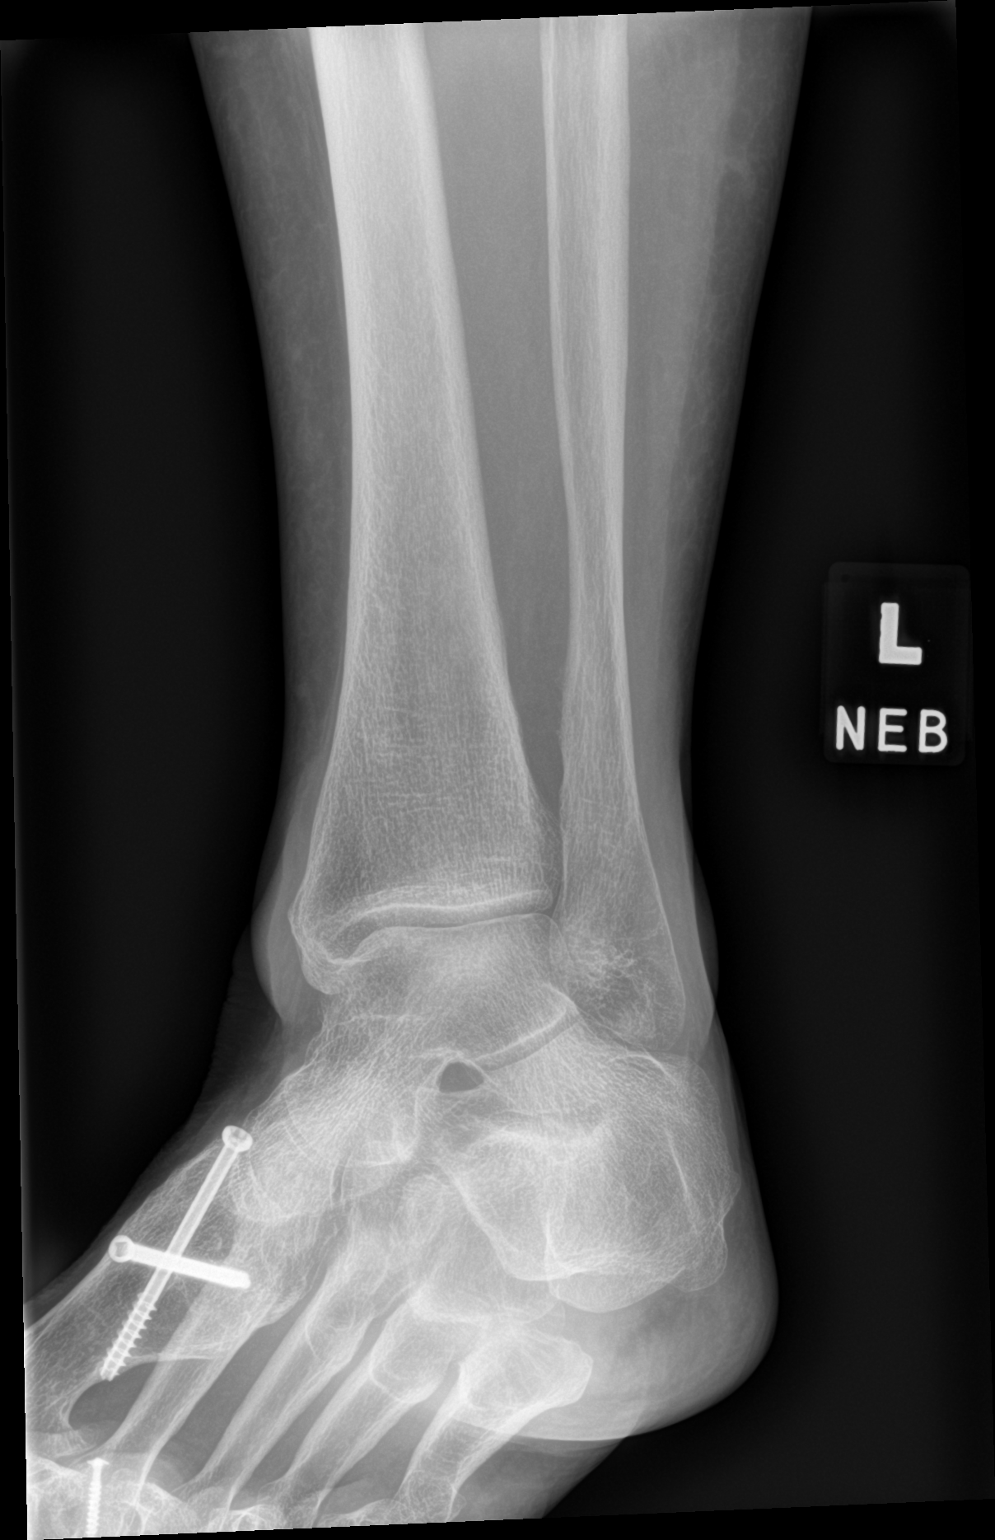

[ankle lat]
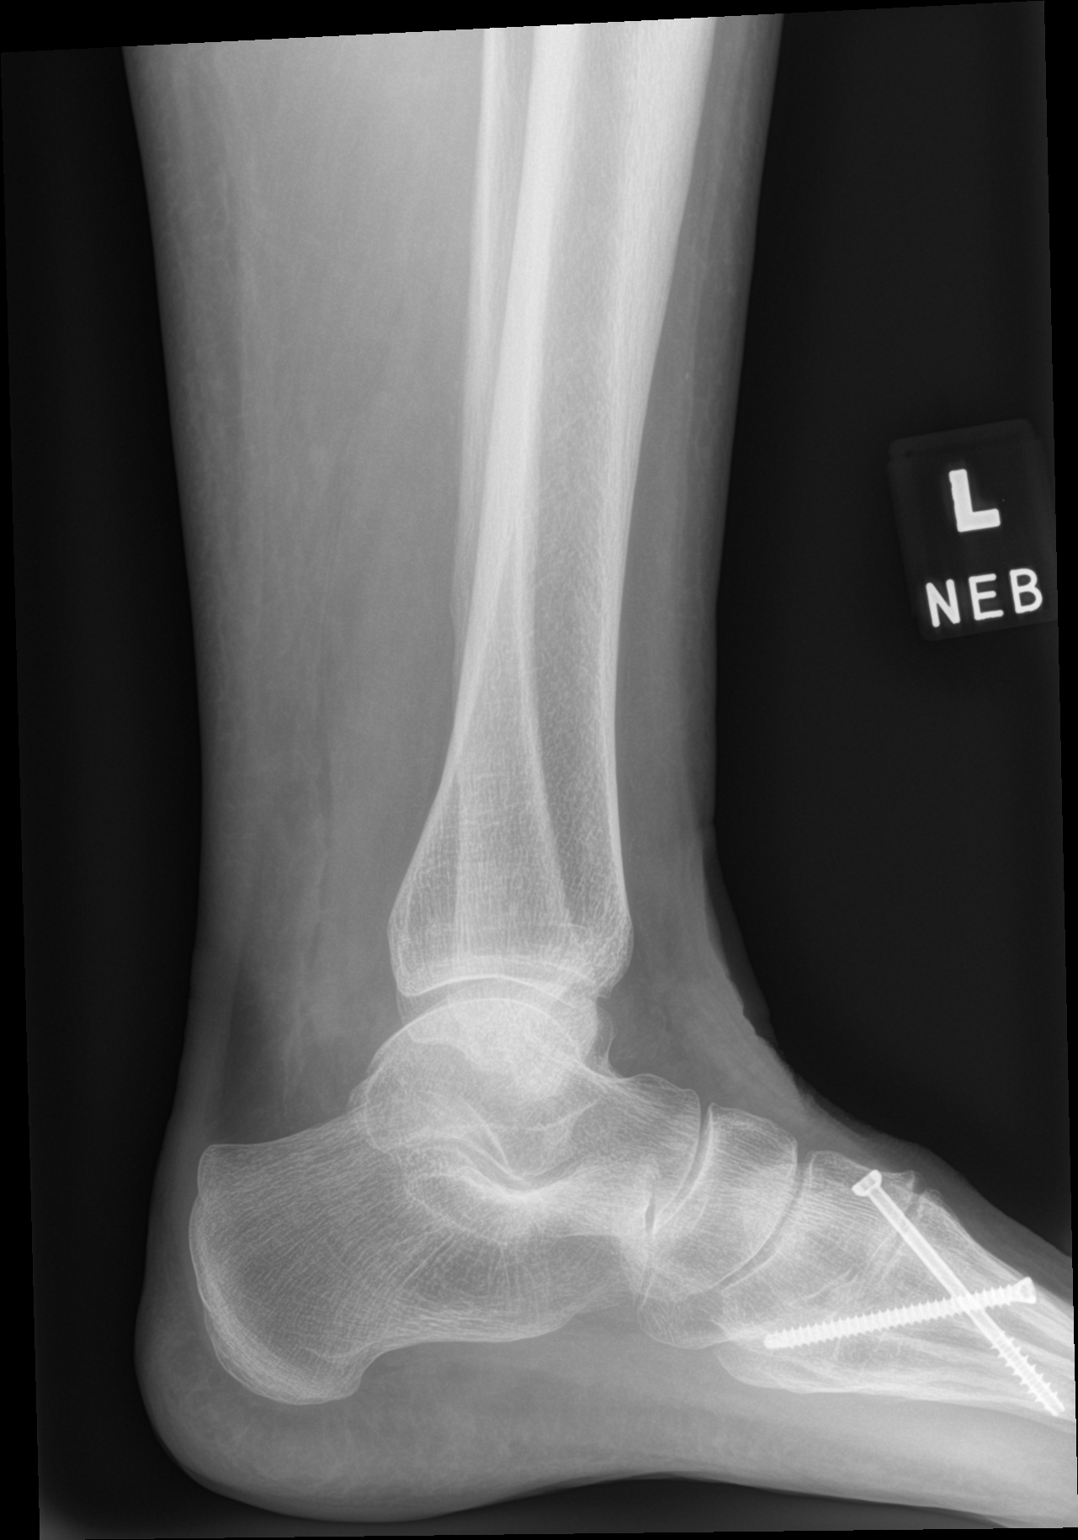

[3 of 3 positions shown; findings below may reference images not displayed]

FINDINGS: There is no acute fracture or dislocation. The bones are osteopenic.
The ankle mortise is intact. Fixation screws in the proximal first
metatarsal. Mild diffuse subcutaneous edema.
IMPRESSION: 1. No acute fracture or dislocation.
2. Osteopenia.

## 2022-12-19 IMAGING — US US EXTREM LOW VENOUS*L*
1 series · 14 of 24 positions shown · non-contrast
Comparison: None.

CLINICAL DATA: MEDIAL LEFT ankle pain and swelling.

EXAM:
LEFT LOWER EXTREMITY VENOUS DOPPLER ULTRASOUND
TECHNIQUE: Gray-scale sonography with compression, as well as color and duplex
ultrasound, were performed to evaluate the deep venous system(s)
from the level of the common femoral vein through the popliteal and
proximal calf veins.

[Series 1: us venous img lower uni left (dvt) · portal-venous · 14 of 62 slices shown]
[im 1/62]
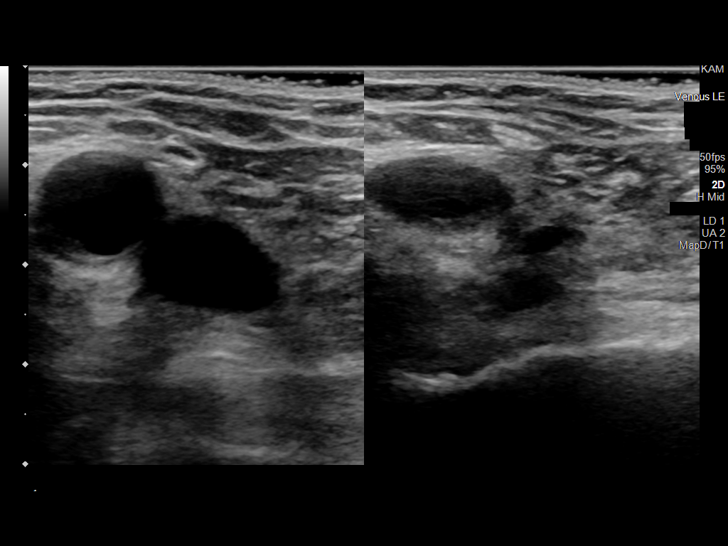
[im 6/62]
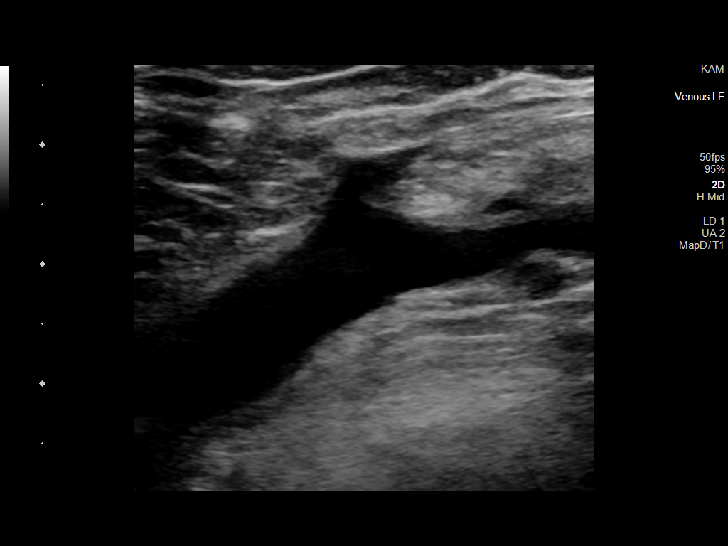
[im 11/62]
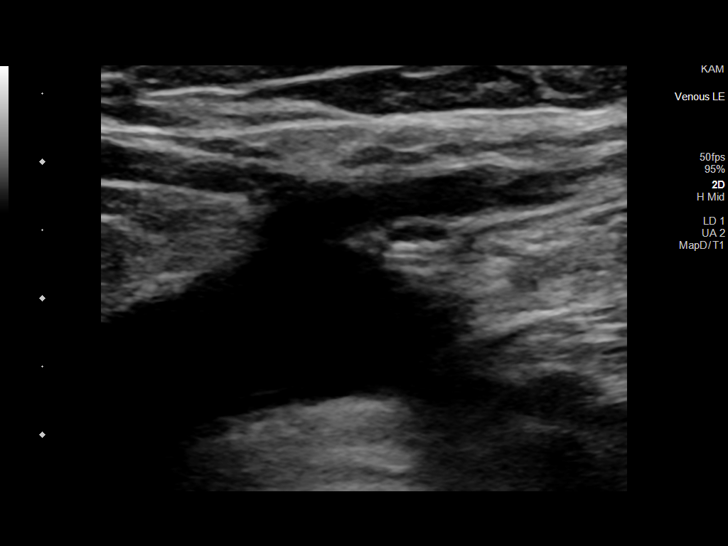
[im 16/62]
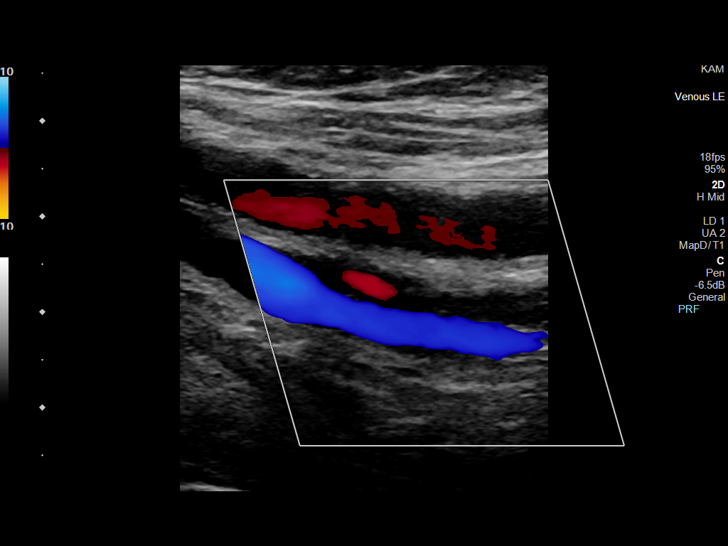
[im 19/62]
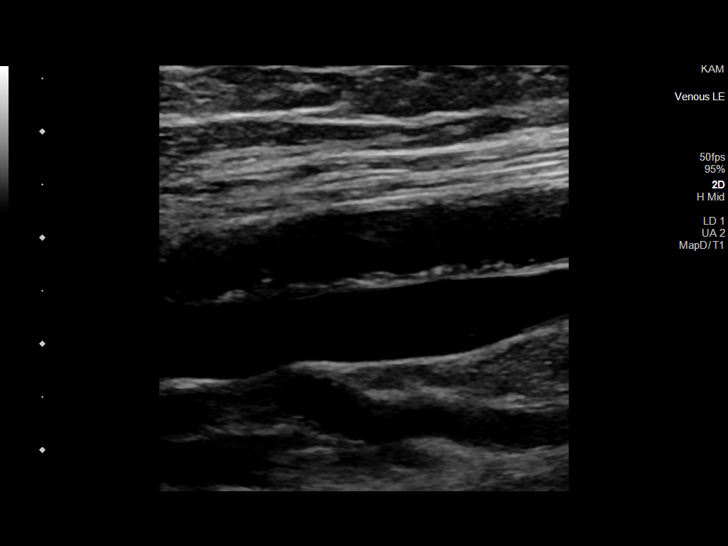
[im 24/62]
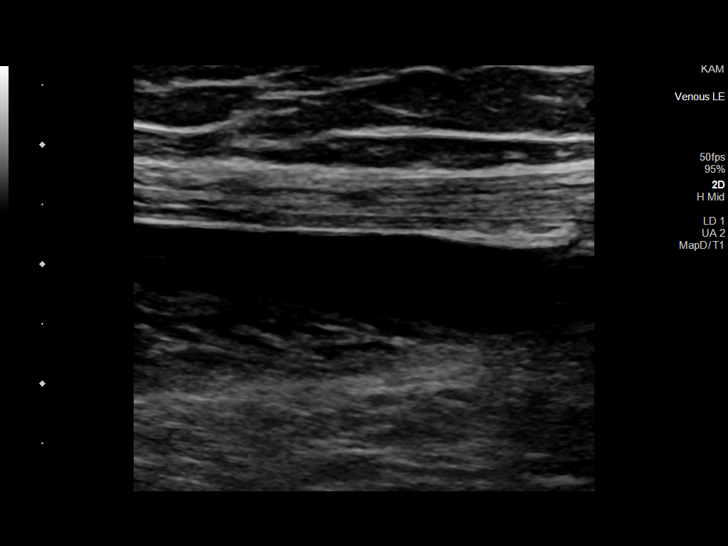
[im 30/62]
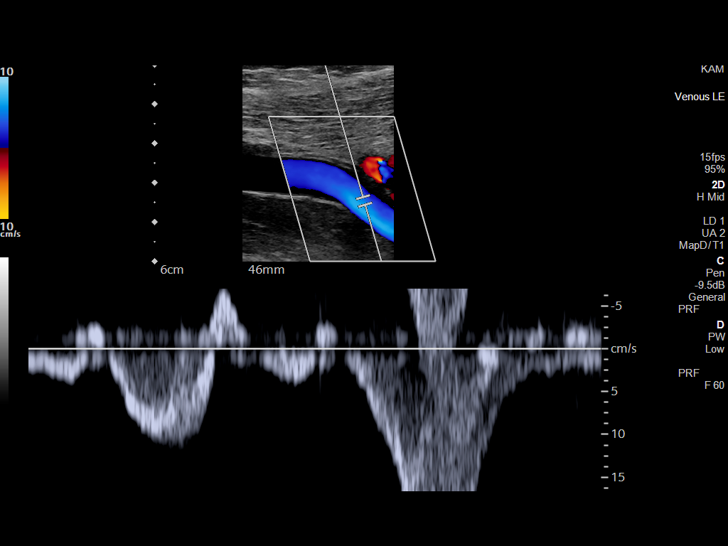
[im 32/62]
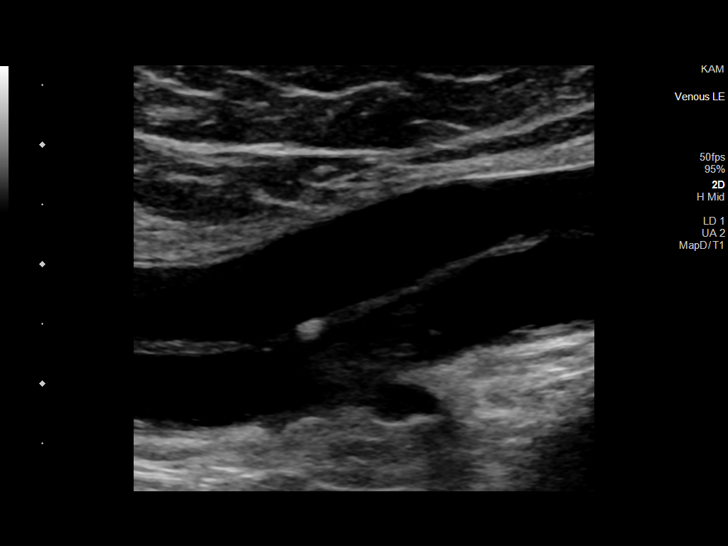
[im 38/62]
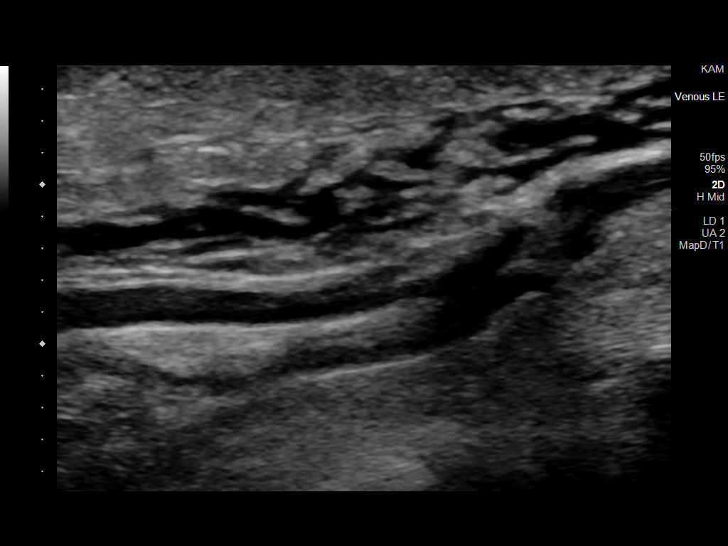
[im 43/62]
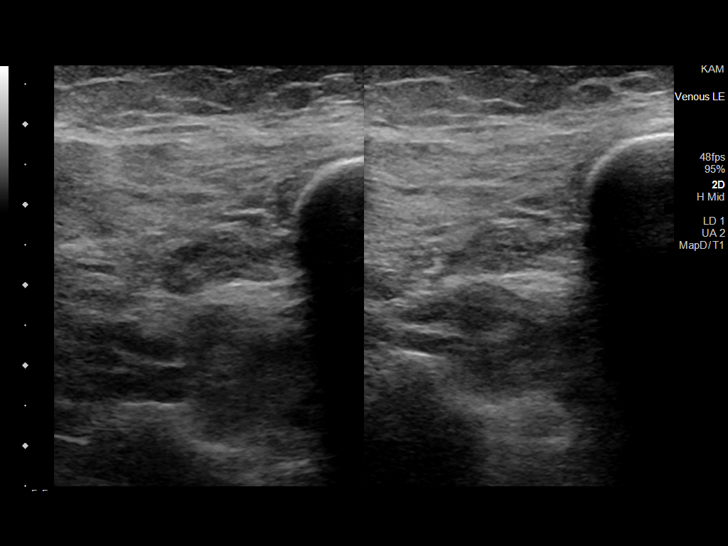
[im 48/62]
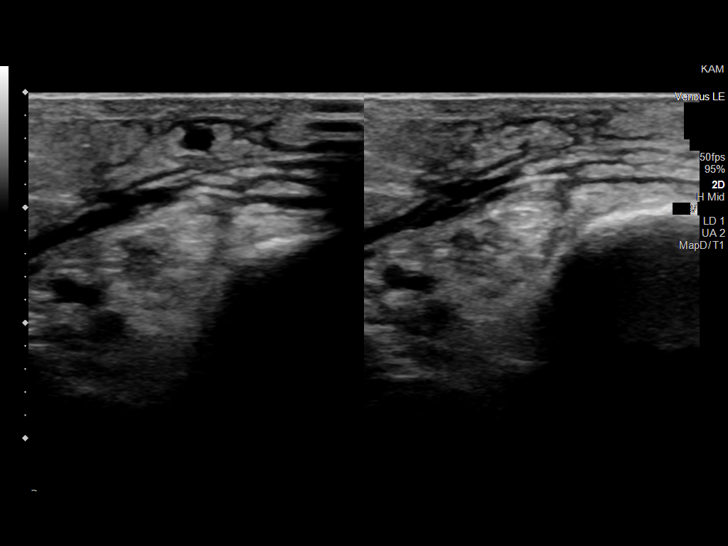
[im 51/62]
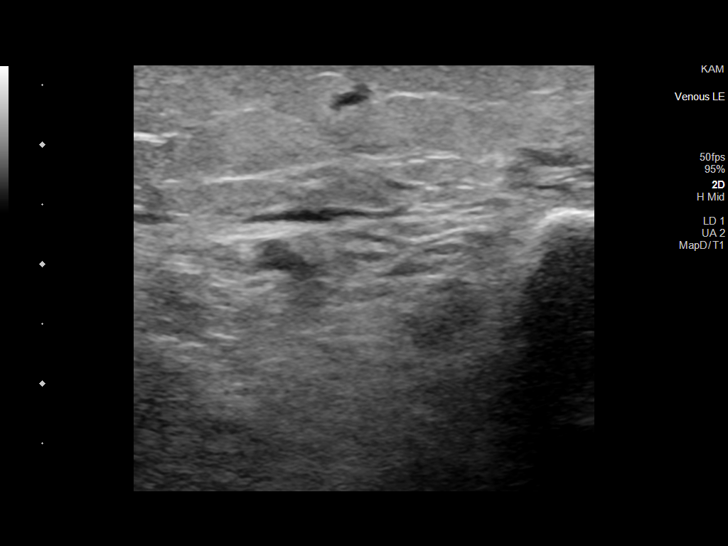
[im 56/62]
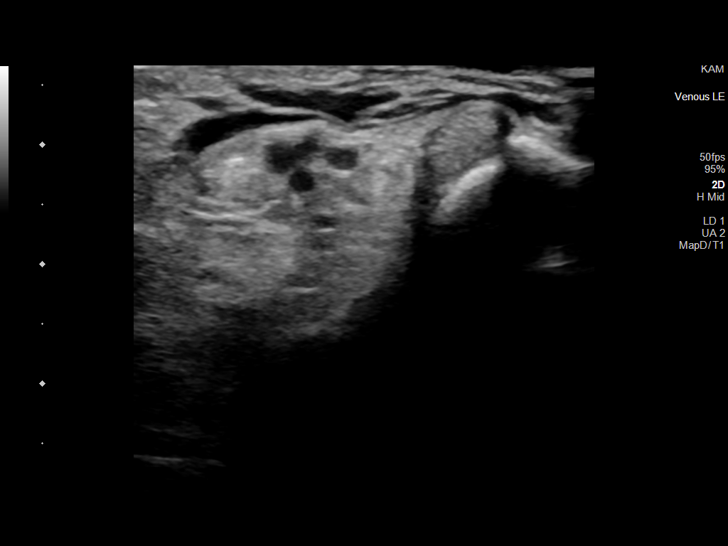
[im 62/62]
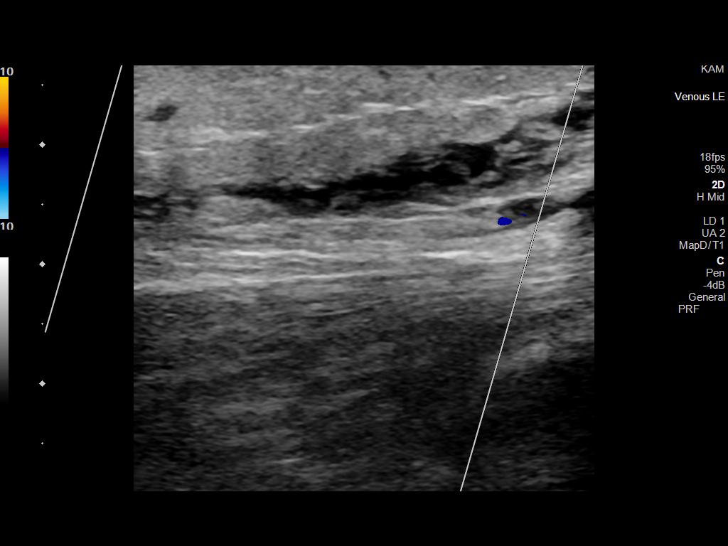

[14 of 24 positions shown; findings below may reference images not displayed]

FINDINGS: VENOUS

Normal compressibility of the common femoral, superficial femoral,
and popliteal veins, as well as the visualized calf veins.
Visualized portions of profunda femoral vein and great saphenous
vein unremarkable. No filling defects to suggest DVT on grayscale or
color Doppler imaging. Doppler waveforms show normal direction of
venous flow, normal respiratory plasticity and response to
augmentation.

Limited views of the contralateral common femoral vein are
unremarkable.

OTHER

None.

Limitations: Fluid is seen along the medial aspect of the left
ankle, as per the ultrasound technologist.
IMPRESSION: No evidence of LEFT lower extremity DVT.

## 2023-01-19 DIAGNOSIS — K080 Exfoliation of teeth due to systemic causes: Secondary | ICD-10-CM | POA: Diagnosis not present

## 2023-03-06 DIAGNOSIS — K080 Exfoliation of teeth due to systemic causes: Secondary | ICD-10-CM | POA: Diagnosis not present

## 2023-04-03 DIAGNOSIS — J189 Pneumonia, unspecified organism: Secondary | ICD-10-CM | POA: Diagnosis not present

## 2023-04-03 DIAGNOSIS — R509 Fever, unspecified: Secondary | ICD-10-CM | POA: Diagnosis not present

## 2023-04-03 DIAGNOSIS — J111 Influenza due to unidentified influenza virus with other respiratory manifestations: Secondary | ICD-10-CM | POA: Diagnosis not present

## 2023-04-03 DIAGNOSIS — R051 Acute cough: Secondary | ICD-10-CM | POA: Diagnosis not present

## 2023-04-05 DIAGNOSIS — J189 Pneumonia, unspecified organism: Secondary | ICD-10-CM | POA: Diagnosis not present

## 2023-04-24 ENCOUNTER — Other Ambulatory Visit: Payer: Medicare Other

## 2023-05-19 DIAGNOSIS — R399 Unspecified symptoms and signs involving the genitourinary system: Secondary | ICD-10-CM | POA: Diagnosis not present

## 2023-05-29 DIAGNOSIS — N390 Urinary tract infection, site not specified: Secondary | ICD-10-CM | POA: Diagnosis not present

## 2023-06-05 DIAGNOSIS — K080 Exfoliation of teeth due to systemic causes: Secondary | ICD-10-CM | POA: Diagnosis not present

## 2023-06-19 DIAGNOSIS — K080 Exfoliation of teeth due to systemic causes: Secondary | ICD-10-CM | POA: Diagnosis not present

## 2023-08-15 DIAGNOSIS — Z0000 Encounter for general adult medical examination without abnormal findings: Secondary | ICD-10-CM | POA: Diagnosis not present

## 2023-08-15 DIAGNOSIS — Z1331 Encounter for screening for depression: Secondary | ICD-10-CM | POA: Diagnosis not present

## 2023-08-24 DIAGNOSIS — R5383 Other fatigue: Secondary | ICD-10-CM | POA: Diagnosis not present

## 2023-08-24 DIAGNOSIS — E039 Hypothyroidism, unspecified: Secondary | ICD-10-CM | POA: Diagnosis not present

## 2023-08-24 DIAGNOSIS — M810 Age-related osteoporosis without current pathological fracture: Secondary | ICD-10-CM | POA: Diagnosis not present

## 2023-08-24 DIAGNOSIS — M2550 Pain in unspecified joint: Secondary | ICD-10-CM | POA: Diagnosis not present

## 2023-08-31 DIAGNOSIS — H5213 Myopia, bilateral: Secondary | ICD-10-CM | POA: Diagnosis not present

## 2023-09-18 DIAGNOSIS — R748 Abnormal levels of other serum enzymes: Secondary | ICD-10-CM | POA: Diagnosis not present

## 2023-09-21 DIAGNOSIS — K080 Exfoliation of teeth due to systemic causes: Secondary | ICD-10-CM | POA: Diagnosis not present

## 2023-10-09 DIAGNOSIS — R300 Dysuria: Secondary | ICD-10-CM | POA: Diagnosis not present

## 2023-10-09 DIAGNOSIS — M810 Age-related osteoporosis without current pathological fracture: Secondary | ICD-10-CM | POA: Diagnosis not present

## 2023-10-09 DIAGNOSIS — N390 Urinary tract infection, site not specified: Secondary | ICD-10-CM | POA: Diagnosis not present

## 2023-10-09 DIAGNOSIS — Z01419 Encounter for gynecological examination (general) (routine) without abnormal findings: Secondary | ICD-10-CM | POA: Diagnosis not present

## 2023-10-13 DIAGNOSIS — Z1231 Encounter for screening mammogram for malignant neoplasm of breast: Secondary | ICD-10-CM | POA: Diagnosis not present

## 2023-10-20 DIAGNOSIS — M25562 Pain in left knee: Secondary | ICD-10-CM | POA: Diagnosis not present

## 2023-10-20 DIAGNOSIS — M1712 Unilateral primary osteoarthritis, left knee: Secondary | ICD-10-CM | POA: Diagnosis not present

## 2023-10-30 DIAGNOSIS — K080 Exfoliation of teeth due to systemic causes: Secondary | ICD-10-CM | POA: Diagnosis not present

## 2023-11-08 DIAGNOSIS — M25562 Pain in left knee: Secondary | ICD-10-CM | POA: Diagnosis not present

## 2023-11-20 DIAGNOSIS — M1712 Unilateral primary osteoarthritis, left knee: Secondary | ICD-10-CM | POA: Diagnosis not present

## 2023-11-29 DIAGNOSIS — G8929 Other chronic pain: Secondary | ICD-10-CM | POA: Diagnosis not present

## 2023-11-29 DIAGNOSIS — M170 Bilateral primary osteoarthritis of knee: Secondary | ICD-10-CM | POA: Diagnosis not present

## 2023-12-06 DIAGNOSIS — M180 Bilateral primary osteoarthritis of first carpometacarpal joints: Secondary | ICD-10-CM | POA: Diagnosis not present

## 2023-12-06 DIAGNOSIS — M19032 Primary osteoarthritis, left wrist: Secondary | ICD-10-CM | POA: Diagnosis not present

## 2023-12-06 DIAGNOSIS — M24131 Other articular cartilage disorders, right wrist: Secondary | ICD-10-CM | POA: Diagnosis not present

## 2023-12-06 DIAGNOSIS — M19031 Primary osteoarthritis, right wrist: Secondary | ICD-10-CM | POA: Diagnosis not present

## 2024-01-10 DIAGNOSIS — R300 Dysuria: Secondary | ICD-10-CM | POA: Diagnosis not present

## 2024-01-31 DIAGNOSIS — M1712 Unilateral primary osteoarthritis, left knee: Secondary | ICD-10-CM | POA: Diagnosis not present
